# Patient Record
Sex: Male | Born: 1965 | Race: Black or African American | Hispanic: No | Marital: Single | Smoking: Former smoker
Health system: Southern US, Community
[De-identification: ages and names within clinical notes are randomized; demographics above are authoritative.]

## PROBLEM LIST (undated history)

## (undated) DIAGNOSIS — G40309 Generalized idiopathic epilepsy and epileptic syndromes, not intractable, without status epilepticus: Secondary | ICD-10-CM

## (undated) DIAGNOSIS — G40409 Other generalized epilepsy and epileptic syndromes, not intractable, without status epilepticus: Secondary | ICD-10-CM

## (undated) DIAGNOSIS — G20 Parkinson's disease: Secondary | ICD-10-CM

## (undated) DIAGNOSIS — R569 Unspecified convulsions: Secondary | ICD-10-CM

## (undated) DIAGNOSIS — J449 Chronic obstructive pulmonary disease, unspecified: Secondary | ICD-10-CM

## (undated) DIAGNOSIS — E119 Type 2 diabetes mellitus without complications: Secondary | ICD-10-CM

## (undated) DIAGNOSIS — G40909 Epilepsy, unspecified, not intractable, without status epilepticus: Secondary | ICD-10-CM

## (undated) DIAGNOSIS — E785 Hyperlipidemia, unspecified: Secondary | ICD-10-CM

## (undated) DIAGNOSIS — F319 Bipolar disorder, unspecified: Secondary | ICD-10-CM

## (undated) DIAGNOSIS — F0390 Unspecified dementia without behavioral disturbance: Secondary | ICD-10-CM

## (undated) DIAGNOSIS — J302 Other seasonal allergic rhinitis: Secondary | ICD-10-CM

## (undated) DIAGNOSIS — G20A1 Parkinson's disease without dyskinesia, without mention of fluctuations: Secondary | ICD-10-CM

## (undated) DIAGNOSIS — N400 Enlarged prostate without lower urinary tract symptoms: Secondary | ICD-10-CM

## (undated) HISTORY — PX: BRAIN SURGERY: SHX531

## (undated) HISTORY — PX: VENTRICULOSTOMY: SUR1435

---
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
## 2012-09-30 ENCOUNTER — Inpatient Hospital Stay: Payer: Self-pay | Admitting: Psychiatry

## 2012-10-01 LAB — BEHAVIORAL MEDICINE 1 PANEL
Albumin: 3.5 g/dL (ref 3.4–5.0)
Calcium, Total: 9.6 mg/dL (ref 8.5–10.1)
Chloride: 108 mmol/L — ABNORMAL HIGH (ref 98–107)
Co2: 29 mmol/L (ref 21–32)
EGFR (Non-African Amer.): >60
Eosinophil #: 0.2 10*3/uL (ref 0.0–0.7)
Eosinophil %: 2.5 %
Glucose: 84 mg/dL (ref 65–99)
HGB: 12 g/dL — ABNORMAL LOW (ref 13.0–18.0)
Lymphocyte #: 1.7 10*3/uL (ref 1.0–3.6)
Lymphocyte %: 22.2 %
MCH: 27.7 pg (ref 26.0–34.0)
MCHC: 34 g/dL (ref 32.0–36.0)
MCV: 82 fL (ref 80–100)
Monocyte #: 0.4 x10 3/mm (ref 0.2–1.0)
Osmolality: 278 (ref 275–301)
Potassium: 4.6 mmol/L (ref 3.5–5.1)
RDW: 12.8 % (ref 11.5–14.5)
SGPT (ALT): 19 U/L (ref 12–78)
Sodium: 141 mmol/L (ref 136–145)
Thyroid Stimulating Horm: 0.012 u[IU]/mL — ABNORMAL LOW
Total Protein: 6.8 g/dL (ref 6.4–8.2)
WBC: 7.6 10*3/uL (ref 3.8–10.6)

## 2012-10-03 LAB — LITHIUM LEVEL: Lithium: 0.7 mmol/L

## 2012-10-04 LAB — LITHIUM LEVEL: Lithium: 0.76 mmol/L

## 2012-10-06 LAB — BASIC METABOLIC PANEL
Chloride: 106 mmol/L (ref 98–107)
Co2: 25 mmol/L (ref 21–32)
EGFR (African American): >60
Glucose: 77 mg/dL (ref 65–99)
Potassium: 4.2 mmol/L (ref 3.5–5.1)

## 2012-10-06 LAB — URINALYSIS, COMPLETE
Bacteria: NONE SEEN
Bilirubin,UR: NEGATIVE
Glucose,UR: NEGATIVE mg/dL (ref 0–75)
Ketone: NEGATIVE
Protein: NEGATIVE
RBC,UR: NONE SEEN /HPF (ref 0–5)
Specific Gravity: 1.01 (ref 1.003–1.030)
WBC UR: <1 /HPF (ref 0–5)

## 2012-10-08 LAB — LITHIUM LEVEL: Lithium: 0.85 mmol/L

## 2012-10-13 LAB — LIPID PANEL
Ldl Cholesterol, Calc: 37 mg/dL (ref 0–100)
Triglycerides: 56 mg/dL (ref 0–200)
VLDL Cholesterol, Calc: 11 mg/dL (ref 5–40)

## 2012-10-13 LAB — AMMONIA: Ammonia, Plasma: 38 mcmol/L — ABNORMAL HIGH (ref 11–32)

## 2012-10-13 LAB — FOLATE: Folic Acid: 13.5 ng/mL (ref 3.1–100.0)

## 2012-10-21 LAB — AMMONIA: Ammonia, Plasma: <25 mcmol/L (ref 11–32)

## 2012-10-27 LAB — BASIC METABOLIC PANEL
BUN: 13 mg/dL (ref 7–18)
Calcium, Total: 9.7 mg/dL (ref 8.5–10.1)
EGFR (African American): >60
EGFR (Non-African Amer.): >60
Glucose: 80 mg/dL (ref 65–99)
Osmolality: 271 (ref 275–301)
Potassium: 4.3 mmol/L (ref 3.5–5.1)
Sodium: 136 mmol/L (ref 136–145)

## 2012-10-27 LAB — LITHIUM LEVEL: Lithium: 0.83 mmol/L

## 2012-11-27 ENCOUNTER — Emergency Department (HOSPITAL_COMMUNITY)
Admission: EM | Admit: 2012-11-27 | Discharge: 2012-11-27 | Disposition: A | Discharge status: Home / Self Care | Payer: Medicaid Other | Attending: Emergency Medicine | Admitting: Emergency Medicine

## 2012-11-27 ENCOUNTER — Encounter (HOSPITAL_COMMUNITY): Payer: Self-pay | Admitting: Emergency Medicine

## 2012-11-27 DIAGNOSIS — E119 Type 2 diabetes mellitus without complications: Secondary | ICD-10-CM | POA: Insufficient documentation

## 2012-11-27 DIAGNOSIS — Z8709 Personal history of other diseases of the respiratory system: Secondary | ICD-10-CM | POA: Insufficient documentation

## 2012-11-27 DIAGNOSIS — F0390 Unspecified dementia without behavioral disturbance: Secondary | ICD-10-CM | POA: Insufficient documentation

## 2012-11-27 DIAGNOSIS — Z87891 Personal history of nicotine dependence: Secondary | ICD-10-CM | POA: Insufficient documentation

## 2012-11-27 DIAGNOSIS — Z760 Encounter for issue of repeat prescription: Secondary | ICD-10-CM | POA: Insufficient documentation

## 2012-11-27 DIAGNOSIS — G40909 Epilepsy, unspecified, not intractable, without status epilepticus: Secondary | ICD-10-CM | POA: Insufficient documentation

## 2012-11-27 DIAGNOSIS — G20A1 Parkinson's disease without dyskinesia, without mention of fluctuations: Secondary | ICD-10-CM | POA: Insufficient documentation

## 2012-11-27 DIAGNOSIS — E785 Hyperlipidemia, unspecified: Secondary | ICD-10-CM | POA: Insufficient documentation

## 2012-11-27 DIAGNOSIS — Z87448 Personal history of other diseases of urinary system: Secondary | ICD-10-CM | POA: Insufficient documentation

## 2012-11-27 DIAGNOSIS — J449 Chronic obstructive pulmonary disease, unspecified: Secondary | ICD-10-CM | POA: Insufficient documentation

## 2012-11-27 DIAGNOSIS — F319 Bipolar disorder, unspecified: Secondary | ICD-10-CM | POA: Insufficient documentation

## 2012-11-27 DIAGNOSIS — J4489 Other specified chronic obstructive pulmonary disease: Secondary | ICD-10-CM | POA: Insufficient documentation

## 2012-11-27 DIAGNOSIS — G20 Parkinson's disease: Secondary | ICD-10-CM | POA: Insufficient documentation

## 2012-11-27 HISTORY — DX: Benign prostatic hyperplasia without lower urinary tract symptoms: N40.0

## 2012-11-27 HISTORY — DX: Parkinson's disease without dyskinesia, without mention of fluctuations: G20.A1

## 2012-11-27 HISTORY — DX: Unspecified convulsions: R56.9

## 2012-11-27 HISTORY — DX: Parkinson's disease: G20

## 2012-11-27 HISTORY — DX: Type 2 diabetes mellitus without complications: E11.9

## 2012-11-27 HISTORY — DX: Hyperlipidemia, unspecified: E78.5

## 2012-11-27 HISTORY — DX: Unspecified dementia, unspecified severity, without behavioral disturbance, psychotic disturbance, mood disturbance, and anxiety: F03.90

## 2012-11-27 HISTORY — DX: Other seasonal allergic rhinitis: J30.2

## 2012-11-27 HISTORY — DX: Bipolar disorder, unspecified: F31.9

## 2012-11-27 HISTORY — DX: Chronic obstructive pulmonary disease, unspecified: J44.9

## 2012-11-27 MED ORDER — ZOLPIDEM TARTRATE 10 MG PO TABS: 10.0000 mg | ORAL_TABLET | Freq: Every day | ORAL | Status: DC

## 2012-11-27 MED ORDER — LITHIUM CARBONATE 300 MG PO CAPS: 450.0000 mg | ORAL_CAPSULE | Freq: Two times a day (BID) | ORAL | Status: DC

## 2012-11-27 MED ORDER — TRIHEXYPHENIDYL HCL 5 MG PO TABS: 5.0000 mg | ORAL_TABLET | Freq: Two times a day (BID) | ORAL | Status: DC

## 2012-11-27 MED ORDER — QUETIAPINE FUMARATE 300 MG PO TABS: 600.0000 mg | ORAL_TABLET | Freq: Two times a day (BID) | ORAL | Status: DC

## 2012-11-27 MED ORDER — METFORMIN HCL 500 MG PO TABS: 500.0000 mg | ORAL_TABLET | Freq: Two times a day (BID) | ORAL | Status: DC

## 2012-11-27 MED ORDER — FLUTICASONE PROPIONATE 50 MCG/ACT NA SUSP: 2.0000 | Freq: Two times a day (BID) | NASAL | Status: DC

## 2012-11-27 MED ORDER — LEVETIRACETAM 500 MG PO TABS: 1000.0000 mg | ORAL_TABLET | Freq: Two times a day (BID) | ORAL | Status: DC

## 2012-11-27 MED ORDER — SIMVASTATIN 20 MG PO TABS: 20.0000 mg | ORAL_TABLET | Freq: Every day | ORAL | Status: DC

## 2012-11-27 MED ORDER — ALBUTEROL SULFATE HFA 108 (90 BASE) MCG/ACT IN AERS: 2.0000 | INHALATION_SPRAY | RESPIRATORY_TRACT | Status: DC | PRN

## 2012-11-27 MED ORDER — TAMSULOSIN HCL 0.4 MG PO CAPS: 0.4000 mg | ORAL_CAPSULE | Freq: Every day | ORAL | Status: AC

## 2012-11-27 NOTE — ED Provider Notes (Signed)
Medical screening examination/treatment/procedure(s) were performed by non-physician practitioner and as supervising physician I was immediately available for consultation/collaboration.   Shanna Cisco, MD 11/27/12 [phone]

## 2012-11-27 NOTE — ED Notes (Signed)
Pt  From home, was seen at his PCP on Thursday and was supposed to have rx's called in but they were never called to pharmacy. Pt needs meds until he can get with his PCP to re-call rx's. Pt has no c/o , just need refills. Pt in NAD. Pt A&O per his normal, pt has hx of dementia.

## 2012-11-27 NOTE — ED Provider Notes (Signed)
CSN: [id number]     Arrival date & time 11/27/12  1128 History   First MD Initiated Contact with Patient 11/27/12 1142     Chief Complaint  Patient presents with  . Medication Refill   (Consider location/radiation/quality/duration/timing/severity/associated sxs/prior Treatment) HPI Comments: Patient presents emergency department with chief complaint of medication refill. Patient states that he saw his primary care provider earlier this week, and that he was supposed to have his medications called in to pharmacy. Patient and his caregiver state that the medications never arrived. It is now Saturday, and they are unable to reach the primary care provider. They state that the patient has sufficient medications for today, but none for tomorrow. They will call the primary care provider on Monday to address the situation. He's not in any apparent distress. Denies any complaints at this time.  The history is provided by the patient. No language interpreter was used.    Past Medical History  Diagnosis Date  . Seasonal allergic rhinitis   . DM (diabetes mellitus)   . Hyperlipidemia   . COPD (chronic obstructive pulmonary disease)   . Bipolar 1 disorder   . Dementia   . Parkinson disease   . Seizures   . BPH (benign prostatic hyperplasia)    Past Surgical History  Procedure Laterality Date  . Brain surgery     No family history on file. History  Substance Use Topics  . Smoking status: Former Games developer  . Smokeless tobacco: Not on file  . Alcohol Use: No    Review of Systems  All other systems reviewed and are negative.    Allergies  Review of patient's allergies indicates no known allergies.  Home Medications   Current Outpatient Rx  Name  Route  Sig  Dispense  Refill  . albuterol (PROVENTIL HFA;VENTOLIN HFA) 108 (90 BASE) MCG/ACT inhaler   Inhalation   Inhale 2 puffs into the lungs every 4 (four) hours as needed for wheezing or shortness of breath.   1 Inhaler   0   .  fluticasone (FLONASE) 50 MCG/ACT nasal spray   Nasal   Place 2 sprays into the nose 2 (two) times daily.   16 g   0   . levETIRAcetam (KEPPRA) 500 MG tablet   Oral   Take 2 tablets (1,000 mg total) by mouth 2 (two) times daily.   60 tablet   0   . lithium carbonate 300 MG capsule   Oral   Take 2 capsules (600 mg total) by mouth 2 (two) times daily with a meal. Take 1.5 capsules BID   60 capsule   0   . metFORMIN (GLUCOPHAGE) 500 MG tablet   Oral   Take 1 tablet (500 mg total) by mouth 2 (two) times daily with a meal.   60 tablet   0   . QUEtiapine (SEROQUEL) 300 MG tablet   Oral   Take 2 tablets (600 mg total) by mouth 2 (two) times daily.   60 tablet   0   . simvastatin (ZOCOR) 20 MG tablet   Oral   Take 1 tablet (20 mg total) by mouth at bedtime.   30 tablet   0   . tamsulosin (FLOMAX) 0.4 MG CAPS capsule   Oral   Take 1 capsule (0.4 mg total) by mouth daily.   30 capsule   0   . trihexyphenidyl (ARTANE) 5 MG tablet   Oral   Take 1 tablet (5 mg total) by mouth 2 (two)  times daily with a meal.   60 tablet   0   . zolpidem (AMBIEN) 10 MG tablet   Oral   Take 1 tablet (10 mg total) by mouth at bedtime.   30 tablet   0    BP 134/82  Pulse 99  Temp(Src) 97.8 F (36.6 C) (Oral)  Resp 20  SpO2 100% Physical Exam  Nursing note and vitals reviewed. Constitutional: He is oriented to person, place, and time. He appears well-developed and well-nourished.  HENT:  Head: Normocephalic and atraumatic.  Eyes: Conjunctivae and EOM are normal. Right eye exhibits no discharge. Left eye exhibits no discharge. No scleral icterus.  Neck: Normal range of motion. Neck supple. No JVD present.  Cardiovascular: Normal rate, regular rhythm and normal heart sounds.  Exam reveals no gallop and no friction rub.   No murmur heard. Pulmonary/Chest: Effort normal and breath sounds normal. No respiratory distress. He has no wheezes. He has no rales. He exhibits no tenderness.   Abdominal: Soft. He exhibits no distension and no mass. There is no tenderness. There is no rebound and no guarding.  Musculoskeletal: Normal range of motion. He exhibits no edema and no tenderness.  Neurological: He is alert and oriented to person, place, and time.  Skin: Skin is warm and dry.  Psychiatric: He has a normal mood and affect. His behavior is normal. Judgment and thought content normal.    ED Course  Procedures (including critical care time) Labs Review Labs Reviewed - No data to display Imaging Review No results found.  MDM   1. Medication refill    Patient needing a medication refill. Will refill the patient's medications for one month. Recommend that he call his primary care provider on Monday to address the situation. Patient caregiver understand and agree with plan. The patient is medications are administered for the patient at his group home. Vital signs stable. Not in any apparent distress. Stable and ready for discharge.   Roxy Horseman, PA-C 11/27/12 1217

## 2012-12-06 ENCOUNTER — Emergency Department (HOSPITAL_COMMUNITY)
Admission: EM | Admit: 2012-12-06 | Discharge: 2012-12-06 | Disposition: A | Discharge status: Skilled Nursing Facility | Payer: Medicaid Other | Attending: Emergency Medicine | Admitting: Emergency Medicine

## 2012-12-06 DIAGNOSIS — J449 Chronic obstructive pulmonary disease, unspecified: Secondary | ICD-10-CM | POA: Insufficient documentation

## 2012-12-06 DIAGNOSIS — N400 Enlarged prostate without lower urinary tract symptoms: Secondary | ICD-10-CM | POA: Insufficient documentation

## 2012-12-06 DIAGNOSIS — F0390 Unspecified dementia without behavioral disturbance: Secondary | ICD-10-CM | POA: Insufficient documentation

## 2012-12-06 DIAGNOSIS — Z79899 Other long term (current) drug therapy: Secondary | ICD-10-CM | POA: Insufficient documentation

## 2012-12-06 DIAGNOSIS — R569 Unspecified convulsions: Secondary | ICD-10-CM

## 2012-12-06 DIAGNOSIS — E119 Type 2 diabetes mellitus without complications: Secondary | ICD-10-CM | POA: Insufficient documentation

## 2012-12-06 DIAGNOSIS — G20A1 Parkinson's disease without dyskinesia, without mention of fluctuations: Secondary | ICD-10-CM | POA: Insufficient documentation

## 2012-12-06 DIAGNOSIS — IMO0002 Reserved for concepts with insufficient information to code with codable children: Secondary | ICD-10-CM | POA: Insufficient documentation

## 2012-12-06 DIAGNOSIS — E785 Hyperlipidemia, unspecified: Secondary | ICD-10-CM | POA: Insufficient documentation

## 2012-12-06 DIAGNOSIS — F319 Bipolar disorder, unspecified: Secondary | ICD-10-CM | POA: Insufficient documentation

## 2012-12-06 DIAGNOSIS — G20 Parkinson's disease: Secondary | ICD-10-CM | POA: Insufficient documentation

## 2012-12-06 DIAGNOSIS — Z880 Allergy status to penicillin: Secondary | ICD-10-CM | POA: Insufficient documentation

## 2012-12-06 DIAGNOSIS — G40909 Epilepsy, unspecified, not intractable, without status epilepticus: Secondary | ICD-10-CM | POA: Insufficient documentation

## 2012-12-06 DIAGNOSIS — Z87891 Personal history of nicotine dependence: Secondary | ICD-10-CM | POA: Insufficient documentation

## 2012-12-06 DIAGNOSIS — J4489 Other specified chronic obstructive pulmonary disease: Secondary | ICD-10-CM | POA: Insufficient documentation

## 2012-12-06 LAB — CBC WITH DIFFERENTIAL/PLATELET
Eosinophils Relative: 3 % (ref 0–5)
HCT: 42 % (ref 39.0–52.0)
Hemoglobin: 14.2 g/dL (ref 13.0–17.0)
Lymphocytes Relative: 24 % (ref 12–46)
Lymphs Abs: 1.6 10*3/uL (ref 0.7–4.0)
MCH: 27.6 pg (ref 26.0–34.0)
MCV: 81.6 fL (ref 78.0–100.0)
Monocytes Absolute: 0.3 10*3/uL (ref 0.1–1.0)
Monocytes Relative: 4 % (ref 3–12)
RBC: 5.15 MIL/uL (ref 4.22–5.81)
RDW: 13.5 % (ref 11.5–15.5)
WBC: 6.7 10*3/uL (ref 4.0–10.5)

## 2012-12-06 LAB — BASIC METABOLIC PANEL
BUN: 13 mg/dL (ref 6–23)
CO2: 24 mEq/L (ref 19–32)
Chloride: 105 mEq/L (ref 96–112)
Creatinine, Ser: 0.9 mg/dL (ref 0.50–1.35)
GFR calc non Af Amer: >90 mL/min (ref >90)
Glucose, Bld: 73 mg/dL (ref 70–99)
Potassium: 4 mEq/L (ref 3.5–5.1)

## 2012-12-06 LAB — URINALYSIS, ROUTINE W REFLEX MICROSCOPIC
Glucose, UA: NEGATIVE mg/dL
Hgb urine dipstick: NEGATIVE
Ketones, ur: NEGATIVE mg/dL
Protein, ur: NEGATIVE mg/dL
pH: 7 (ref 5.0–8.0)

## 2012-12-06 MED ORDER — LORAZEPAM 2 MG/ML IJ SOLN
1.0000 mg | Freq: Once | INTRAMUSCULAR | Status: AC
  Administered 2012-12-06: 1 mg via INTRAVENOUS
  Filled 2012-12-06: qty 1

## 2012-12-06 MED ORDER — LEVETIRACETAM 500 MG PO TABS
500.0000 mg | ORAL_TABLET | Freq: Once | ORAL | Status: AC
  Administered 2012-12-06: 500 mg via ORAL
  Filled 2012-12-06: qty 1

## 2012-12-06 NOTE — ED Notes (Signed)
Per EMS patient from group home with Hx of asthma, bipolar and schizophrenia disorder had witnessed seizure, witness states seizure affected upper extremities and neck and that it lasted approximately five minutes. Per EMS, group home staff states patient had an additional witnessed seizure early this morning. Per EMS patient's mental baseline is disoriented, patient is currently oriented only to self.

## 2012-12-06 NOTE — ED Notes (Signed)
Bed: [facility] Expected date:  Expected time:  Means of arrival:  Comments: ems  

## 2012-12-06 NOTE — ED Provider Notes (Signed)
CSN: [id number]     Arrival date & time 12/06/12  1021 History   First MD Initiated Contact with Patient 12/06/12 1041     Chief Complaint  Patient presents with  . Seizures   (Consider location/radiation/quality/duration/timing/severity/associated sxs/prior Treatment) HPI Comments: Patient presents to the ER for evaluation of seizure. Patient reports that he has had a seizure disorder for 20 years. Patient had a generalized tonic-clonic seizure that lasted 5 minutes, witnessed by group home staff. Patient likely had an earlier seizure this morning as well. It is unclear how frequently he has seizures. Patient is postictal on arrival, but now is back to his normal baseline. He is awake, alert and oriented. Patient is without complaints. He reports that he has been taking all of his medications as prescribed.  Patient is a 47 y.o. male presenting with seizures.  Seizures   Past Medical History  Diagnosis Date  . Seasonal allergic rhinitis   . DM (diabetes mellitus)   . Hyperlipidemia   . COPD (chronic obstructive pulmonary disease)   . Bipolar 1 disorder   . Dementia   . Parkinson disease   . Seizures   . BPH (benign prostatic hyperplasia)    Past Surgical History  Procedure Laterality Date  . Brain surgery     No family history on file. History  Substance Use Topics  . Smoking status: Former Games developer  . Smokeless tobacco: Not on file  . Alcohol Use: No    Review of Systems  Neurological: Positive for seizures.  All other systems reviewed and are negative.    Allergies  Penicillins  Home Medications   Current Outpatient Rx  Name  Route  Sig  Dispense  Refill  . albuterol (PROVENTIL HFA;VENTOLIN HFA) 108 (90 BASE) MCG/ACT inhaler   Inhalation   Inhale 2 puffs into the lungs every 4 (four) hours as needed for wheezing or shortness of breath.   1 Inhaler   0   . fluticasone (FLONASE) 50 MCG/ACT nasal spray   Nasal   Place 2 sprays into the nose 2 (two) times  daily.   16 g   0   . levETIRAcetam (KEPPRA) 500 MG tablet   Oral   Take 2 tablets (1,000 mg total) by mouth 2 (two) times daily.   60 tablet   0   . lithium carbonate 300 MG capsule   Oral   Take 2 capsules (600 mg total) by mouth 2 (two) times daily with a meal. Take 1.5 capsules BID   60 capsule   0   . metFORMIN (GLUCOPHAGE) 500 MG tablet   Oral   Take 1 tablet (500 mg total) by mouth 2 (two) times daily with a meal.   60 tablet   0   . QUEtiapine (SEROQUEL) 300 MG tablet   Oral   Take 2 tablets (600 mg total) by mouth 2 (two) times daily.   60 tablet   0   . simvastatin (ZOCOR) 20 MG tablet   Oral   Take 1 tablet (20 mg total) by mouth at bedtime.   30 tablet   0   . tamsulosin (FLOMAX) 0.4 MG CAPS capsule   Oral   Take 1 capsule (0.4 mg total) by mouth daily.   30 capsule   0   . trihexyphenidyl (ARTANE) 5 MG tablet   Oral   Take 1 tablet (5 mg total) by mouth 2 (two) times daily with a meal.   60 tablet   0   .  zolpidem (AMBIEN) 10 MG tablet   Oral   Take 1 tablet (10 mg total) by mouth at bedtime.   30 tablet   0    BP 128/71  Pulse 102  Temp(Src) 97.5 F (36.4 C) (Oral)  Resp 16  SpO2 98% Physical Exam  Constitutional: He is oriented to person, place, and time. He appears well-developed and well-nourished. No distress.  HENT:  Head: Normocephalic and atraumatic.  Right Ear: Hearing normal.  Left Ear: Hearing normal.  Nose: Nose normal.  Mouth/Throat: Oropharynx is clear and moist and mucous membranes are normal.  Eyes: Conjunctivae and EOM are normal. Pupils are equal, round, and reactive to light.  Neck: Normal range of motion. Neck supple.  Cardiovascular: Regular rhythm, S1 normal and S2 normal.  Exam reveals no gallop and no friction rub.   No murmur heard. Pulmonary/Chest: Effort normal and breath sounds normal. No respiratory distress. He exhibits no tenderness.  Abdominal: Soft. Normal appearance and bowel sounds are normal.  There is no hepatosplenomegaly. There is no tenderness. There is no rebound, no guarding, no tenderness at McBurney's point and negative Murphy's sign. No hernia.  Musculoskeletal: Normal range of motion.  Neurological: He is alert and oriented to person, place, and time. He has normal strength. No cranial nerve deficit or sensory deficit. Coordination normal. GCS eye subscore is 4. GCS verbal subscore is 5. GCS motor subscore is 6.  Skin: Skin is warm, dry and intact. No rash noted. No cyanosis.  Psychiatric: He has a normal mood and affect. His speech is normal and behavior is normal. Thought content normal.    ED Course  Procedures (including critical care time)  EKG:  Date: 12/06/2012  Rate: 93  Rhythm: normal sinus rhythm  QRS Axis: normal  Intervals: normal  ST/T Wave abnormalities: normal  Conduction Disutrbances: none  Narrative Interpretation: unremarkable     Labs Review Labs Reviewed  URINALYSIS, ROUTINE W REFLEX MICROSCOPIC  CBC WITH DIFFERENTIAL  AMYLASE  BASIC METABOLIC PANEL  CBC WITH DIFFERENTIAL  PHENYTOIN LEVEL, TOTAL  PHENOBARBITAL LEVEL  VALPROIC ACID LEVEL   Imaging Review No results found.  MDM  Diagnosis: Seizure  Patient presents to the ER for evaluation after seizure. Patient was unsure what medication he actually takes for seizures. He had different times indicated phenobarbital Dilantin and Depakote. Our records indicate that he is on Keppra, however. Patient has not had any seizure activity here in the ER. He was given Ativan. Blood work was unremarkable. The patient will be returned to the group home, continue his current medications and followup with his neurologist.    Gilda Crease, MD 12/06/12 1525

## 2012-12-20 ENCOUNTER — Emergency Department (HOSPITAL_BASED_OUTPATIENT_CLINIC_OR_DEPARTMENT_OTHER)
Admission: EM | Admit: 2012-12-20 | Discharge: 2012-12-21 | Disposition: A | Discharge status: Other Acute Inpatient Hospital | Payer: Medicaid Other | Attending: Emergency Medicine | Admitting: Emergency Medicine

## 2012-12-20 ENCOUNTER — Encounter (HOSPITAL_BASED_OUTPATIENT_CLINIC_OR_DEPARTMENT_OTHER): Payer: Self-pay | Admitting: Emergency Medicine

## 2012-12-20 ENCOUNTER — Emergency Department (HOSPITAL_BASED_OUTPATIENT_CLINIC_OR_DEPARTMENT_OTHER): Payer: Medicaid Other

## 2012-12-20 DIAGNOSIS — S06379A Contusion, laceration, and hemorrhage of cerebellum with loss of consciousness of unspecified duration, initial encounter: Secondary | ICD-10-CM

## 2012-12-20 DIAGNOSIS — F0280 Dementia in other diseases classified elsewhere without behavioral disturbance: Secondary | ICD-10-CM | POA: Insufficient documentation

## 2012-12-20 DIAGNOSIS — E785 Hyperlipidemia, unspecified: Secondary | ICD-10-CM | POA: Insufficient documentation

## 2012-12-20 DIAGNOSIS — G40909 Epilepsy, unspecified, not intractable, without status epilepticus: Secondary | ICD-10-CM | POA: Insufficient documentation

## 2012-12-20 DIAGNOSIS — S060X9A Concussion with loss of consciousness of unspecified duration, initial encounter: Secondary | ICD-10-CM | POA: Insufficient documentation

## 2012-12-20 DIAGNOSIS — J4489 Other specified chronic obstructive pulmonary disease: Secondary | ICD-10-CM | POA: Insufficient documentation

## 2012-12-20 DIAGNOSIS — W1809XA Striking against other object with subsequent fall, initial encounter: Secondary | ICD-10-CM | POA: Insufficient documentation

## 2012-12-20 DIAGNOSIS — IMO0002 Reserved for concepts with insufficient information to code with codable children: Secondary | ICD-10-CM | POA: Insufficient documentation

## 2012-12-20 DIAGNOSIS — Z9889 Other specified postprocedural states: Secondary | ICD-10-CM | POA: Insufficient documentation

## 2012-12-20 DIAGNOSIS — F3110 Bipolar disorder, current episode manic without psychotic features, unspecified: Secondary | ICD-10-CM | POA: Insufficient documentation

## 2012-12-20 DIAGNOSIS — W010XXA Fall on same level from slipping, tripping and stumbling without subsequent striking against object, initial encounter: Secondary | ICD-10-CM | POA: Insufficient documentation

## 2012-12-20 DIAGNOSIS — N400 Enlarged prostate without lower urinary tract symptoms: Secondary | ICD-10-CM | POA: Insufficient documentation

## 2012-12-20 DIAGNOSIS — J449 Chronic obstructive pulmonary disease, unspecified: Secondary | ICD-10-CM | POA: Insufficient documentation

## 2012-12-20 DIAGNOSIS — Y9389 Activity, other specified: Secondary | ICD-10-CM | POA: Insufficient documentation

## 2012-12-20 DIAGNOSIS — E119 Type 2 diabetes mellitus without complications: Secondary | ICD-10-CM | POA: Insufficient documentation

## 2012-12-20 DIAGNOSIS — Z87891 Personal history of nicotine dependence: Secondary | ICD-10-CM | POA: Insufficient documentation

## 2012-12-20 DIAGNOSIS — Z79899 Other long term (current) drug therapy: Secondary | ICD-10-CM | POA: Insufficient documentation

## 2012-12-20 DIAGNOSIS — G20A1 Parkinson's disease without dyskinesia, without mention of fluctuations: Secondary | ICD-10-CM | POA: Insufficient documentation

## 2012-12-20 DIAGNOSIS — Y9210 Unspecified residential institution as the place of occurrence of the external cause: Secondary | ICD-10-CM | POA: Insufficient documentation

## 2012-12-20 DIAGNOSIS — W19XXXA Unspecified fall, initial encounter: Secondary | ICD-10-CM

## 2012-12-20 DIAGNOSIS — G20 Parkinson's disease: Secondary | ICD-10-CM | POA: Insufficient documentation

## 2012-12-20 LAB — BASIC METABOLIC PANEL
BUN: 16 mg/dL (ref 6–23)
Calcium: 10.5 mg/dL (ref 8.4–10.5)
Chloride: 105 mEq/L (ref 96–112)
Creatinine, Ser: 1 mg/dL (ref 0.50–1.35)
GFR calc Af Amer: >90 mL/min (ref >90)
Glucose, Bld: 104 mg/dL — ABNORMAL HIGH (ref 70–99)
Potassium: 3.5 mEq/L (ref 3.5–5.1)

## 2012-12-20 LAB — URINALYSIS, ROUTINE W REFLEX MICROSCOPIC
Bilirubin Urine: NEGATIVE
Glucose, UA: NEGATIVE mg/dL
Hgb urine dipstick: NEGATIVE
Ketones, ur: NEGATIVE mg/dL
Leukocytes, UA: NEGATIVE
Nitrite: NEGATIVE
Protein, ur: NEGATIVE mg/dL
pH: 6 (ref 5.0–8.0)

## 2012-12-20 LAB — CBC WITH DIFFERENTIAL/PLATELET
Basophils Absolute: 0 10*3/uL (ref 0.0–0.1)
Basophils Relative: 0 % (ref 0–1)
Eosinophils Relative: 2 % (ref 0–5)
HCT: 41.2 % (ref 39.0–52.0)
MCH: 28 pg (ref 26.0–34.0)
MCHC: 33.7 g/dL (ref 30.0–36.0)
MCV: 83.1 fL (ref 78.0–100.0)
Monocytes Absolute: 0.4 10*3/uL (ref 0.1–1.0)
Monocytes Relative: 4 % (ref 3–12)
Neutrophils Relative %: 66 % (ref 43–77)
RDW: 14.2 % (ref 11.5–15.5)

## 2012-12-20 NOTE — ED Provider Notes (Signed)
CSN: [id number]     Arrival date & time 12/20/12  1836 History  This chart was scribed for Coral Ceo, PA, working with Shanna Cisco, MD by Blanchard Kelch, ED Scribe. This patient was seen in room MH06/MH06 and the patient's care was started at 7:01 PM.    Chief Complaint  Patient presents with  . Fall    Patient is a 47 y.o. male presenting with fall. The history is provided by the patient. No language interpreter was used.  Fall Pertinent negatives include no chest pain, no abdominal pain, no headaches and no shortness of breath.   HPI Comments: Austin Carney is a 47 y.o. male with a PMH of Bipolar disorder, DM, dementia, parkinson disease, seizures, HLD, COPD, and BPH who presents to the Emergency Department complaining of a fall.  Patient is a poor historian.  Patient repeatedly asking the same questions.  Patient states that about 1.5 hours ago he was walking to his bed when he started shaking. He states the next thing he remembers was waking up on the floor.  He said he may have fallen backwards hitting his head on the wall.  He denies any headache, injuries, wounds, tongue trauma, or loss of bowel/bladder function.  Patient states that he had this about a year ago when he had a seizure.  He states he has not had a seizure in a year. He is on Keppra with no missed doses.  Patient lives at a group home and is not sure who found him.  He has no complaints currently.  No neck pain, chest pain, SOB, abdominal pain, nausea, emesis, extremity pain, dizziness, lightheadedness, or wounds.     Past Medical History  Diagnosis Date  . Seasonal allergic rhinitis   . DM (diabetes mellitus)   . Hyperlipidemia   . COPD (chronic obstructive pulmonary disease)   . Bipolar 1 disorder   . Dementia   . Parkinson disease   . Seizures   . BPH (benign prostatic hyperplasia)    Past Surgical History  Procedure Laterality Date  . Brain surgery     History reviewed. No pertinent family  history. History  Substance Use Topics  . Smoking status: Former Games developer  . Smokeless tobacco: Not on file  . Alcohol Use: No    Review of Systems  Constitutional: Negative for fever, chills, activity change, appetite change and fatigue.  HENT: Negative for dental problem, mouth sores and sore throat.   Eyes: Negative for photophobia and visual disturbance.  Respiratory: Negative for cough, shortness of breath and wheezing.   Cardiovascular: Negative for chest pain and leg swelling.  Gastrointestinal: Negative for nausea, vomiting and abdominal pain.  Genitourinary: Negative for dysuria and hematuria.  Musculoskeletal: Negative for arthralgias, back pain, gait problem, joint swelling, myalgias, neck pain and neck stiffness.  Skin: Negative for color change and wound.  Neurological: Positive for seizures. Negative for dizziness, weakness, light-headedness, numbness and headaches.  All other systems reviewed and are negative.    Allergies  Review of patient's allergies indicates no known allergies.  Home Medications   Current Outpatient Rx  Name  Route  Sig  Dispense  Refill  . albuterol (PROVENTIL HFA;VENTOLIN HFA) 108 (90 BASE) MCG/ACT inhaler   Inhalation   Inhale 2 puffs into the lungs every 4 (four) hours as needed for wheezing or shortness of breath.   1 Inhaler   0   . fluticasone (FLONASE) 50 MCG/ACT nasal spray   Nasal   Place  2 sprays into the nose 2 (two) times daily.   16 g   0   . levETIRAcetam (KEPPRA) 500 MG tablet   Oral   Take 2 tablets (1,000 mg total) by mouth 2 (two) times daily.   60 tablet   0   . lithium carbonate 300 MG capsule   Oral   Take 2 capsules (600 mg total) by mouth 2 (two) times daily with a meal. Take 1.5 capsules BID   60 capsule   0   . metFORMIN (GLUCOPHAGE) 500 MG tablet   Oral   Take 1 tablet (500 mg total) by mouth 2 (two) times daily with a meal.   60 tablet   0   . QUEtiapine (SEROQUEL) 300 MG tablet   Oral   Take  2 tablets (600 mg total) by mouth 2 (two) times daily.   60 tablet   0   . simvastatin (ZOCOR) 20 MG tablet   Oral   Take 1 tablet (20 mg total) by mouth at bedtime.   30 tablet   0   . tamsulosin (FLOMAX) 0.4 MG CAPS capsule   Oral   Take 1 capsule (0.4 mg total) by mouth daily.   30 capsule   0   . trihexyphenidyl (ARTANE) 5 MG tablet   Oral   Take 1 tablet (5 mg total) by mouth 2 (two) times daily with a meal.   60 tablet   0   . zolpidem (AMBIEN) 10 MG tablet   Oral   Take 1 tablet (10 mg total) by mouth at bedtime.   30 tablet   0    Triage Vitals: BP 141/91  Pulse 102  Temp(Src) 99 F (37.2 C) (Oral)  Resp 20  SpO2 100%  Filed Vitals:   12/20/12 2125 12/20/12 2231 12/21/12 0003 12/21/12 0948  BP: 136/84 142/89 142/89 125/77  Pulse: 85 97 88 77  Temp: 98.3 F (36.8 C)   97.8 F (36.6 C)  TempSrc: Oral   Oral  Resp: 20 20 18    SpO2: 100% 100% 100% 100%    Physical Exam  Nursing note and vitals reviewed. Constitutional: He is oriented to person, place, and time. He appears well-developed and well-nourished. No distress.  HENT:  Head: Normocephalic and atraumatic.  Right Ear: External ear normal.  Left Ear: External ear normal.  Nose: Nose normal.  Mouth/Throat: Oropharynx is clear and moist. No oropharyngeal exudate.  No tenderness to palpation to the scalp throughout. No palpable step-offs, lacerations, or hematoma.  Surgical scar present on the right scalp.  TM's gray and translucent bilaterally.   Eyes: Conjunctivae and EOM are normal. Pupils are equal, round, and reactive to light. Right eye exhibits no discharge. Left eye exhibits no discharge.  Neck: Normal range of motion. Neck supple. No tracheal deviation present.  No cervical spinal or paraspinal tenderness to palpation throughout.  No limitations with ROM.   Cardiovascular: Normal rate, regular rhythm, normal heart sounds and intact distal pulses.  Exam reveals no gallop and no friction rub.    No murmur heard. Radial and dorsalis pedis pulses present and equal bilaterally  Pulmonary/Chest: Effort normal and breath sounds normal. No respiratory distress. He has no wheezes. He has no rales. He exhibits no tenderness.  Abdominal: Soft. Bowel sounds are normal. He exhibits no distension and no mass. There is no tenderness. There is no rebound and no guarding.  Musculoskeletal: Normal range of motion. He exhibits no edema and no tenderness.  No tenderness to palpation to the UE or LE throughout. Strength 5/5 in the UE and LE.  Patient able to ambulate without difficulty or ataxia  Neurological: He is alert and oriented to person, place, and time.  GCS 15. No focal neurological deficits. Sensation intact in the UE and LE. CN 2-12 intact. Finger to nose intact.   Skin: Skin is warm and dry. He is not diaphoretic.  Psychiatric:  Patient asking the same questions shortly after they are answered.  He consistently states the same phrases intermittently throughout exam. (repeating the alphabet backwards and reciting scripture verses). Patient laughing throughout exam.     ED Course  Procedures (including critical care time)  DIAGNOSTIC STUDIES: Oxygen Saturation is 100% on room air, normal by my interpretation.    COORDINATION OF CARE: 7:01 PM - Patient verbalizes understanding and agrees with treatment plan.   Labs Review Labs Reviewed - No data to display Imaging Review No results found.  EKG Interpretation   None        Date: 12/21/2012  Rate: 93  Rhythm: normal sinus rhythm  QRS Axis: normal  Intervals: normal  ST/T Wave abnormalities: normal  Conduction Disutrbances:none  Narrative Interpretation:   Old EKG Reviewed: none available  Results for orders placed during the hospital encounter of 12/20/12  CBC WITH DIFFERENTIAL      Result Value Range   WBC 9.0  4.0 - 10.5 K/uL   RBC 4.96  4.22 - 5.81 MIL/uL   Hemoglobin 13.9  13.0 - 17.0 g/dL   HCT 16.1  09.6 - 04.5  %   MCV 83.1  78.0 - 100.0 fL   MCH 28.0  26.0 - 34.0 pg   MCHC 33.7  30.0 - 36.0 g/dL   RDW 40.9  81.1 - 91.4 %   Platelets 222  150 - 400 K/uL   Neutrophils Relative % 66  43 - 77 %   Neutro Abs 5.9  1.7 - 7.7 K/uL   Lymphocytes Relative 28  12 - 46 %   Lymphs Abs 2.6  0.7 - 4.0 K/uL   Monocytes Relative 4  3 - 12 %   Monocytes Absolute 0.4  0.1 - 1.0 K/uL   Eosinophils Relative 2  0 - 5 %   Eosinophils Absolute 0.1  0.0 - 0.7 K/uL   Basophils Relative 0  0 - 1 %   Basophils Absolute 0.0  0.0 - 0.1 K/uL  BASIC METABOLIC PANEL      Result Value Range   Sodium 142  135 - 145 mEq/L   Potassium 3.5  3.5 - 5.1 mEq/L   Chloride 105  96 - 112 mEq/L   CO2 28  19 - 32 mEq/L   Glucose, Bld 104 (*) 70 - 99 mg/dL   BUN 16  6 - 23 mg/dL   Creatinine, Ser 7.82  0.50 - 1.35 mg/dL   Calcium 95.6  8.4 - 21.3 mg/dL   GFR calc non Af Amer 87 (*) >90 mL/min   GFR calc Af Amer >90  >90 mL/min  URINALYSIS, ROUTINE W REFLEX MICROSCOPIC      Result Value Range   Color, Urine YELLOW  YELLOW   APPearance CLEAR  CLEAR   Specific Gravity, Urine 1.022  1.005 - 1.030   pH 6.0  5.0 - 8.0   Glucose, UA NEGATIVE  NEGATIVE mg/dL   Hgb urine dipstick NEGATIVE  NEGATIVE   Bilirubin Urine NEGATIVE  NEGATIVE   Ketones, ur NEGATIVE  NEGATIVE mg/dL   Protein, ur NEGATIVE  NEGATIVE mg/dL   Urobilinogen, UA 1.0  0.0 - 1.0 mg/dL   Nitrite NEGATIVE  NEGATIVE   Leukocytes, UA NEGATIVE  NEGATIVE  LITHIUM LEVEL      Result Value Range   Lithium Lvl 0.44 (*) 0.80 - 1.40 mEq/L     MDM   1. Fall, initial encounter   2. Head injury, closed, with LOC of unknown duration, initial encounter     Austin Carney is a 47 y.o. male with a PMH of Bipolar disorder, DM, dementia, parkinson disease, seizures, HLD, COPD, and BPH who presents to the Emergency Department complaining of a fall. Head CT ordered due to history of fall with LOC and poor history.  EKG, CBC, BMP, UA, and lithium level ordered.    Rechecks   12:15 AM = Patient ambulating around the room in no acute distress. Discussed results and plan.  Patient in agreement with going back to the group home.      2:03 AM = spoke with caregiver who is present in the emergency room. Caregiver states that he does not feel comfortable taking the patient home. Caregiver states that the patient has been "manic". He states that Austin Carney has been labile with episodes of extreme joy/happiness followed by episodes of severe depression. He states that the patient is taking his medications as prescribed but they are "not working." He cannot get an appointment with the patient's psychiatrist and is out of the patient's Keppra, Lithium, and Seroquel (last doses given today).  He contacted Dr. Dyke Brackett PCP however he would not refill his psych medications.  I told him that I will refill his Keppra however the patient needs to see a psychiatrist to get his psych medications refilled.  Caregiver states the meds aren't working and believes that they have something to do with his "fall" today.  Caregiver states they heard a loud bang this evening and found a hole in the wall in his bedroom.  They state that Korver likely fell and hit his head through the wall, which is why he was sent to the ED today.      Patient was evaluated in the ED for a fall.  It is unclear whether the patient had a seizure. Caregivers did not report a post-ictal state. There was no external signs of trauma on exam. His head CT was negative for an acute intracranial process. He remained in no acute distress throughout his ED visit and had no complaints.  His labs were unremarkable.  His EKG negative for acute ischemic changes. His lithium level was low in the ED. Discharge instructions for Lithium are conflicting with two different dose levels.  Patient was likely being under dosed.  Caregiver was contacted by the RN and stated that the behavior at this time was his baseline.  When the caregiver  arrived he retracted this statement due to the low lithium levels.  He states that he has been more manic than usual and he requires psychiatric evaluation.  He does not feel safe taking the patient home.  Will consult TTS and await their opinion.    2:30 AM = Signing out care to Dr. Read Drivers who will await recommendation from behavioral health.     Final impressions: 1. Fall  2. Head injury  3. Bipolar disorder  4. Dementia  5. Seizure disorder     Luiz Iron PA-C   This patient was discussed with Dr. Micheline Maze  Jillyn Ledger, PA-C 12/22/12 [id number]

## 2012-12-20 NOTE — ED Notes (Signed)
Pt from group home,  Stumbled and fell against wall,  No loc, no complaint from pt, staff wanted him checked out pt a&o, ambulatory

## 2012-12-20 NOTE — ED Notes (Signed)
Patient transported to CT 

## 2012-12-20 NOTE — ED Notes (Signed)
tct group home at [phone] also attempted to call [phone] mr. Benit, manager of group, return call received from dara white, telephone number obtained for mr. benit [phone]. Spoke with [name], who stated that they do administer medications to patient daily and that he has been taking his medications as prescribed by physician

## 2012-12-21 MED ORDER — TAMSULOSIN HCL 0.4 MG PO CAPS
0.4000 mg | ORAL_CAPSULE | Freq: Every day | ORAL | Status: DC
  Administered 2012-12-21: 0.4 mg via ORAL
  Filled 2012-12-21: qty 1

## 2012-12-21 MED ORDER — HALOPERIDOL 5 MG PO TABS
ORAL_TABLET | ORAL | Status: AC
  Filled 2012-12-21: qty 1

## 2012-12-21 MED ORDER — SIMVASTATIN 20 MG PO TABS
20.0000 mg | ORAL_TABLET | Freq: Every day | ORAL | Status: DC
  Filled 2012-12-21: qty 1

## 2012-12-21 MED ORDER — LEVETIRACETAM 500 MG PO TABS: 1000.0000 mg | ORAL_TABLET | Freq: Two times a day (BID) | ORAL | Status: DC

## 2012-12-21 MED ORDER — HALOPERIDOL 5 MG PO TABS
5.0000 mg | ORAL_TABLET | Freq: Once | ORAL | Status: AC
  Administered 2012-12-21: 5 mg via ORAL

## 2012-12-21 MED ORDER — ZIPRASIDONE HCL 20 MG PO CAPS
20.0000 mg | ORAL_CAPSULE | Freq: Once | ORAL | Status: DC
  Filled 2012-12-21: qty 1

## 2012-12-21 MED ORDER — ACETAMINOPHEN 325 MG PO TABS: 650.0000 mg | ORAL_TABLET | ORAL | Status: DC | PRN

## 2012-12-21 MED ORDER — QUETIAPINE FUMARATE 300 MG PO TABS: 600.0000 mg | ORAL_TABLET | Freq: Two times a day (BID) | ORAL | Status: DC

## 2012-12-21 MED ORDER — LITHIUM CARBONATE 150 MG PO CAPS: 450.0000 mg | ORAL_CAPSULE | Freq: Two times a day (BID) | ORAL | Status: DC

## 2012-12-21 MED ORDER — ALUM & MAG HYDROXIDE-SIMETH 200-200-20 MG/5ML PO SUSP: 30.0000 mL | ORAL | Status: DC | PRN

## 2012-12-21 MED ORDER — LEVETIRACETAM 500 MG PO TABS
1000.0000 mg | ORAL_TABLET | Freq: Two times a day (BID) | ORAL | Status: DC
  Administered 2012-12-21: 1000 mg via ORAL
  Filled 2012-12-21: qty 2

## 2012-12-21 MED ORDER — ALBUTEROL SULFATE HFA 108 (90 BASE) MCG/ACT IN AERS: 2.0000 | INHALATION_SPRAY | RESPIRATORY_TRACT | Status: DC | PRN

## 2012-12-21 MED ORDER — METFORMIN HCL 500 MG PO TABS
500.0000 mg | ORAL_TABLET | Freq: Two times a day (BID) | ORAL | Status: DC
  Administered 2012-12-21: 500 mg via ORAL
  Filled 2012-12-21: qty 1

## 2012-12-21 MED ORDER — ONDANSETRON HCL 8 MG PO TABS: 4.0000 mg | ORAL_TABLET | Freq: Three times a day (TID) | ORAL | Status: DC | PRN

## 2012-12-21 NOTE — ED Notes (Signed)
Pt's brother Ramon Dredge returned phone call to this nurse. Ramon Dredge reports that pt has been to multiple places to stay, including group homes, an assisted living facility and [facility]; reports that pt is bipolar and that he goes from extremely happy and laughing to angry and throwing objects, therefore was unable to stay at [facility] home d/t children living in home. Ramon Dredge also reports that pt is often manipulative and that "He' doesn't like anywhere he goes. He thinks he has multiple options for a place to live and he does not". Ramon Dredge reports that he speaks frequently with the staff at the group home he is in now and that the facility is very clean, well-taken-care-of facility. When asked about the group home not getting prescriptions filled for pt and keeping f/u appointments, he was not aware of this issue. Ramon Dredge requested I call Tresa Endo at group home and request further information, and to call Ramon Dredge back with updates.

## 2012-12-21 NOTE — ED Notes (Signed)
Long conversation with caretaker, Ivonne Andrew, over the phone. Caretaker states that patient has not had follow up since last er visit for seizure. Pt was given 15 day script for keppra and dilantin, apparently there was some confusion as of the dosage of the dilantin and patient has only been receiving 450mg  dilantin bid instead of 600mg  bid. Md aware that patient will need a script for keppra, dilantin and seroquel prior to d/c as he can not get in for follow up until the 27th

## 2012-12-21 NOTE — ED Notes (Signed)
Long discussion with caretaker, who now states that he does not feel safe taking the patient home because ER doctor refuses to give him prescription for Lithium. Caretaker claims that he has been unable to get him in to see psychiatrist and general practitioner would not write for psychotropic medications. PA involved in conversation and agreement made to have patient evaluated by tele psych

## 2012-12-21 NOTE — ED Notes (Signed)
Tele psych speaking with pt in room via computer.
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
## 2012-12-21 NOTE — ED Notes (Signed)
Spoke with Tresa Endo, supervisor of group home and he sts he is getting all of pt's meds filled from the General Practitioner, but that he was not willing to fill Keppra, Seroquel or Lithium and that their soonest appt was not until Oct 27th, which was not definitely scheduled at this point. Kelly requests that I call [facility] at [phone] to make a f/u appt for him.

## 2012-12-21 NOTE — ED Notes (Signed)
Per patient request, called Ramon Dredge, his brother at [phone]; no answer; left message to call this RN back with [facility] phone number.

## 2012-12-21 NOTE — ED Notes (Signed)
Spoke with Melissa R at [facility] who states the following needs to take place:  1- Walk-In to [facility] M-F from 8:30am- 3p, for initial assessment -----has to have the following: [employer], Guardian Present, Photo ID 2. AFTER the initial assessment, they will schedule an appointment to see the MD and get prescriptions filled at appoint with MD.  First appt as of now is Nov 3. Informed ER MD and MD has provided enough medications to last until this appt.   All of this information has been relayed to Joselyn Glassman, supervisor of Group Home.

## 2012-12-21 NOTE — ED Provider Notes (Signed)
11:04 AM Psych has cleared patient, feel he is ok to go back on his psych meds (not taking at the group home). The group home has been called and acknowledges that they will take him back as long as he is back on his psychiatric medicines. He does not have another psych visit until 20 days from now. We'll restart his Keppra, lithium, and Seroquel so that he has enough to make it to his appointment.  Audree Camel, MD 12/21/12 [phone]

## 2012-12-21 NOTE — ED Notes (Signed)
Long discussion again with caretaker regarding medication refills. Informed caretaker that patient will need to follow up with psychiatrist or PCP for refills of medication. Caretaker, not understanding why we can not write for his medications. Explained to Austin Carney the reasons why ER doc VS Primary doc would write meds and that he will need to be monitored closely, which has not been happening recently.

## 2012-12-21 NOTE — ED Notes (Signed)
tct to tele psych, requesting estimated time for assessment, per tele psych, there are three ahead of him to be assessed, currently looking at sometime in early am before assessment can occur

## 2012-12-21 NOTE — ED Provider Notes (Signed)
6:26 AM Patient has been stable overnight. He was given Haldol 5 mg for agitation. Hold and orders have been in her but his psychiatric medications (specifically lithium, Seroquel and Artane) have been held pending psychiatric consultation; he has not been compliant with these medications recently.  Hanley Seamen, MD 12/21/12 [phone]

## 2012-12-22 NOTE — ED Provider Notes (Signed)
Medical screening examination/treatment/procedure(s) were performed by non-physician practitioner and as supervising physician I was immediately available for consultation/collaboration.  Javarian Jakubiak E Atlas Kuc, MD 12/22/12 1235 

## 2013-03-12 ENCOUNTER — Emergency Department (HOSPITAL_COMMUNITY)
Admission: EM | Admit: 2013-03-12 | Discharge: 2013-03-12 | Disposition: A | Discharge status: Home / Self Care | Payer: Medicaid Other | Attending: Emergency Medicine | Admitting: Emergency Medicine

## 2013-03-12 ENCOUNTER — Encounter (HOSPITAL_COMMUNITY): Payer: Self-pay | Admitting: Emergency Medicine

## 2013-03-12 DIAGNOSIS — Z87891 Personal history of nicotine dependence: Secondary | ICD-10-CM | POA: Insufficient documentation

## 2013-03-12 DIAGNOSIS — Z79899 Other long term (current) drug therapy: Secondary | ICD-10-CM | POA: Insufficient documentation

## 2013-03-12 DIAGNOSIS — J449 Chronic obstructive pulmonary disease, unspecified: Secondary | ICD-10-CM | POA: Insufficient documentation

## 2013-03-12 DIAGNOSIS — G20A1 Parkinson's disease without dyskinesia, without mention of fluctuations: Secondary | ICD-10-CM | POA: Insufficient documentation

## 2013-03-12 DIAGNOSIS — J4489 Other specified chronic obstructive pulmonary disease: Secondary | ICD-10-CM | POA: Insufficient documentation

## 2013-03-12 DIAGNOSIS — E785 Hyperlipidemia, unspecified: Secondary | ICD-10-CM | POA: Insufficient documentation

## 2013-03-12 DIAGNOSIS — Z760 Encounter for issue of repeat prescription: Secondary | ICD-10-CM | POA: Insufficient documentation

## 2013-03-12 DIAGNOSIS — G40909 Epilepsy, unspecified, not intractable, without status epilepticus: Secondary | ICD-10-CM | POA: Insufficient documentation

## 2013-03-12 DIAGNOSIS — G20 Parkinson's disease: Secondary | ICD-10-CM | POA: Insufficient documentation

## 2013-03-12 DIAGNOSIS — Z9889 Other specified postprocedural states: Secondary | ICD-10-CM | POA: Insufficient documentation

## 2013-03-12 DIAGNOSIS — E119 Type 2 diabetes mellitus without complications: Secondary | ICD-10-CM | POA: Insufficient documentation

## 2013-03-12 DIAGNOSIS — Z9109 Other allergy status, other than to drugs and biological substances: Secondary | ICD-10-CM | POA: Insufficient documentation

## 2013-03-12 DIAGNOSIS — N400 Enlarged prostate without lower urinary tract symptoms: Secondary | ICD-10-CM | POA: Insufficient documentation

## 2013-03-12 DIAGNOSIS — F319 Bipolar disorder, unspecified: Secondary | ICD-10-CM | POA: Insufficient documentation

## 2013-03-12 DIAGNOSIS — IMO0002 Reserved for concepts with insufficient information to code with codable children: Secondary | ICD-10-CM | POA: Insufficient documentation

## 2013-03-12 DIAGNOSIS — F0280 Dementia in other diseases classified elsewhere without behavioral disturbance: Secondary | ICD-10-CM | POA: Insufficient documentation

## 2013-03-12 LAB — CBC WITH DIFFERENTIAL/PLATELET
BASOS PCT: 0 % (ref 0–1)
Basophils Absolute: 0 10*3/uL (ref 0.0–0.1)
Eosinophils Absolute: 0.1 10*3/uL (ref 0.0–0.7)
Eosinophils Relative: 2 % (ref 0–5)
HCT: 39.2 % (ref 39.0–52.0)
Hemoglobin: 13.2 g/dL (ref 13.0–17.0)
LYMPHS ABS: 1.6 10*3/uL (ref 0.7–4.0)
Lymphocytes Relative: 25 % (ref 12–46)
MCH: 28.5 pg (ref 26.0–34.0)
MCHC: 33.7 g/dL (ref 30.0–36.0)
MCV: 84.7 fL (ref 78.0–100.0)
MONOS PCT: 4 % (ref 3–12)
Monocytes Absolute: 0.2 10*3/uL (ref 0.1–1.0)
NEUTROS ABS: 4.2 10*3/uL (ref 1.7–7.7)
NEUTROS PCT: 69 % (ref 43–77)
PLATELETS: 202 10*3/uL (ref 150–400)
RBC: 4.63 MIL/uL (ref 4.22–5.81)
RDW: 12.8 % (ref 11.5–15.5)
WBC: 6.1 10*3/uL (ref 4.0–10.5)

## 2013-03-12 LAB — BASIC METABOLIC PANEL
BUN: 14 mg/dL (ref 6–23)
CO2: 25 mEq/L (ref 19–32)
Calcium: 9.9 mg/dL (ref 8.4–10.5)
Chloride: 104 mEq/L (ref 96–112)
Creatinine, Ser: 1.03 mg/dL (ref 0.50–1.35)
GFR calc non Af Amer: 84 mL/min — ABNORMAL LOW (ref >90)
Glucose, Bld: 80 mg/dL (ref 70–99)
POTASSIUM: 4.3 meq/L (ref 3.7–5.3)
Sodium: 140 mEq/L (ref 137–147)

## 2013-03-12 LAB — LITHIUM LEVEL: Lithium Lvl: 1.19 mEq/L (ref 0.80–1.40)

## 2013-03-12 MED ORDER — FLUTICASONE PROPIONATE HFA 110 MCG/ACT IN AERO: 1.0000 | INHALATION_SPRAY | Freq: Two times a day (BID) | RESPIRATORY_TRACT | Status: DC

## 2013-03-12 MED ORDER — ALBUTEROL SULFATE HFA 108 (90 BASE) MCG/ACT IN AERS: 2.0000 | INHALATION_SPRAY | RESPIRATORY_TRACT | Status: AC | PRN

## 2013-03-12 MED ORDER — LORAZEPAM 2 MG/ML IJ SOLN
1.0000 mg | Freq: Once | INTRAMUSCULAR | Status: AC
  Administered 2013-03-12: 1 mg via INTRAVENOUS
  Filled 2013-03-12: qty 1

## 2013-03-12 NOTE — Discharge Instructions (Signed)
Scheduled an appointment with a doctor as soon as possible  Take your medications as scheduled  Return to the ED if you have any confusion, weakness, seizure lasting more than 2-5 minutes, fever, stiff neck, slurred speech, or any other concerns (see below)   Seizure, Adult A seizure is abnormal electrical activity in the brain. Seizures can cause a change in attention or behavior (altered mental status). Seizures often involve uncontrollable shaking (convulsions). Seizures usually last from 30 seconds to 2 minutes. Epilepsy is a brain disorder in which a patient has repeated seizures over time. CAUSES  There are many different problems that can cause seizures. In some cases, no cause is found. Common causes of seizures include:  Head injuries.  Brain tumors.  Infections.  Imbalance of chemicals in the blood.  Kidney failure or liver failure.  Heart disease.  Drug abuse.  Stroke.  Withdrawal from certain drugs or alcohol.  Birth defects.  Malfunction of a neurosurgical device placed in the brain. SYMPTOMS  Symptoms vary depending on the part of the brain that is involved. Right before a seizure, you may have a warning (aura) that a seizure is about to occur. An aura may include the following symptoms:   Fear or anxiety.  Nausea.  Feeling like the room is spinning (vertigo).  Vision changes, such as seeing flashing lights or spots. Common symptoms during a seizure include:  Convulsions.  Drooling.  Rapid eye movements.  Grunting.  Loss of bladder and bowel control.  Bitter taste in the mouth. After a seizure, you may feel confused and sleepy. You may also have an injury resulting from convulsions during the seizure. DIAGNOSIS  Your caregiver will perform a physical exam and run some tests to determine the type and cause of your seizure. These tests may include:  Blood tests.  A lumbar puncture test. In this test, a small amount of fluid is removed from the  spine and examined.  Electrocardiography (ECG). This test records the electrical activity in your heart.  Imaging tests, such as computed tomography (CT) scans or magnetic resonance imaging (MRI).  Electroencephalography (EEG). This test records the electrical activity in your brain. TREATMENT  Seizures usually stop on their own. Treatment will depend on the cause of your seizure. In some cases, medicine may be given to prevent future seizures. HOME CARE INSTRUCTIONS   If you are given medicines, take them exactly as prescribed by your caregiver.  Keep all follow-up appointments as directed by your caregiver.  Do not swim or drive until your caregiver says it is okay.  Teach friends and family what to do if you have a seizure. They should:  Lay you on the ground to prevent a fall.  Put a cushion under your head.  Loosen any tight clothing around your neck.  Turn you on your side. If vomiting occurs, this helps keep your airway clear.  Stay with you until you recover. SEEK IMMEDIATE MEDICAL CARE IF:  The seizure lasts longer than 2 to 5 minutes.  The seizure is severe or the person does not wake up after the seizure.  The person has altered mental status. Drive the person to the emergency department or call your local emergency services (911 in [place] MAKE SURE YOU:  Understand these instructions.  Will watch your condition.  Will get help right away if you are not doing well or get worse. Document Released: 02/22/2000 Document Revised: 05/19/2011 Document Reviewed: 02/12/2011 [facility] Patient Information 2014 [place], [place].  Medication Refill,  Emergency Department We have refilled your medication today as a courtesy to you. It is best for your medical care, however, to take care of getting refills done through your primary caregiver's office. They have your records and can do a better job of follow-up than we can in the emergency department. On maintenance  medications, we often only prescribe enough medications to get you by until you are able to see your regular caregiver. This is a more expensive way to refill medications. In the future, please plan for refills so that you will not have to use the emergency department for this. Thank you for your help. Your help allows [place] to better take care of the daily emergencies that enter our department. Document Released: 06/13/2003 Document Revised: 05/19/2011 Document Reviewed: 02/24/2005 [facility] Patient Information 2014 [place], [place].

## 2013-03-12 NOTE — ED Provider Notes (Signed)
CSN: [id number]     Arrival date & time 03/12/13  1248 History   First MD Initiated Contact with Patient 03/12/13 1300     Chief Complaint  Patient presents with  . Dizziness   (Consider location/radiation/quality/duration/timing/severity/associated sxs/prior Treatment) HPI Comments: Patient is a 48 year old male with history of bipolar 1 disorder, schizophrenia, seizure disorder who presents today with chief complaint of seizure aura. 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 He has not had a seizure in 4 months. He reports that he was placed on phenobarbital and Dilantin which controlled his seizures. He was then switched to Depakote to control both his seizure disorder and bipolar 1 disorder.  He now is taking Keppra and Lithium. He is here today to get his albuterol and flovent refilled as well as his lithium level checked. He denies any other complaint.   The history is provided by the patient. No language interpreter was used.    Past Medical History  Diagnosis Date  . Seasonal allergic rhinitis   . DM (diabetes mellitus)   . Hyperlipidemia   . COPD (chronic obstructive pulmonary disease)   . Bipolar 1 disorder   . Dementia   . Parkinson disease   . Seizures   . BPH (benign prostatic hyperplasia)    Past Surgical History  Procedure Laterality Date  . Brain surgery     History reviewed. No pertinent family history. History  Substance Use Topics  . Smoking status: Former Games developer  . Smokeless tobacco: Not on file  . Alcohol Use: No    Review of Systems  Constitutional: Negative for fever and chills.  Respiratory: Negative for shortness of breath.   Cardiovascular: Negative for chest pain.  Gastrointestinal: Negative for nausea, vomiting and abdominal pain.  Neurological: Negative for seizures (aura only).  All other systems reviewed and are negative.    Allergies  Review of patient's allergies indicates no known allergies.  Home  Medications   Current Outpatient Rx  Name  Route  Sig  Dispense  Refill  . albuterol (PROVENTIL HFA;VENTOLIN HFA) 108 (90 BASE) MCG/ACT inhaler   Inhalation   Inhale 2 puffs into the lungs every 4 (four) hours as needed for wheezing or shortness of breath.   1 Inhaler   0   . fluticasone (FLONASE) 50 MCG/ACT nasal spray   Nasal   Place 2 sprays into the nose 2 (two) times daily.   16 g   0   . levETIRAcetam (KEPPRA) 500 MG tablet   Oral   Take 2 tablets (1,000 mg total) by mouth every 12 (twelve) hours.   6 tablet   0   . lithium carbonate 150 MG capsule   Oral   Take 3 capsules (450 mg total) by mouth 2 (two) times daily with a meal. Take 1.5 capsules BID   120 capsule   0   . metFORMIN (GLUCOPHAGE) 500 MG tablet   Oral   Take 1 tablet (500 mg total) by mouth 2 (two) times daily with a meal.   60 tablet   0   . QUEtiapine (SEROQUEL) 300 MG tablet   Oral   Take 2 tablets (600 mg total) by mouth 2 (two) times daily.   80 tablet   0   . simvastatin (ZOCOR) 20 MG tablet   Oral   Take 1 tablet (20 mg total) by mouth at bedtime.   30 tablet   0   . tamsulosin (FLOMAX) 0.4  MG CAPS capsule   Oral   Take 1 capsule (0.4 mg total) by mouth daily.   30 capsule   0   . trihexyphenidyl (ARTANE) 5 MG tablet   Oral   Take 1 tablet (5 mg total) by mouth 2 (two) times daily with a meal.   60 tablet   0   . zolpidem (AMBIEN) 10 MG tablet   Oral   Take 1 tablet (10 mg total) by mouth at bedtime.   30 tablet   0    BP 134/76  Pulse 97  Temp(Src) 97.6 F (36.4 C) (Oral)  Resp 20  SpO2 100% Physical Exam  Nursing note and vitals reviewed. Constitutional: He is oriented to person, place, and time. He appears well-developed and well-nourished. He does not have a sickly appearance. He does not appear ill. No distress.  HENT:  Head: Normocephalic and atraumatic.  Right Ear: External ear normal.  Left Ear: External ear normal.  Nose: Nose normal.  Eyes:  Conjunctivae and EOM are normal. Pupils are equal, round, and reactive to light. Right eye exhibits no nystagmus. Left eye exhibits no nystagmus.  Neck: Normal range of motion. No tracheal deviation present.  Cardiovascular: Normal rate, regular rhythm, normal heart sounds, intact distal pulses and normal pulses.   Pulmonary/Chest: Effort normal and breath sounds normal. No stridor.  Abdominal: Soft. He exhibits no distension. There is no tenderness.  Musculoskeletal: Normal range of motion.  Neurological: He is alert and oriented to person, place, and time. He has normal strength. Coordination and gait normal.  Strength 5/5 in all extremities  Skin: Skin is warm and dry. He is not diaphoretic.  Psychiatric: His behavior is normal. His speech is rapid and/or pressured. His speech is not slurred. Cognition and memory are impaired. He expresses no homicidal and no suicidal ideation.  Patient frequently asking the same question and telling same story. He frequently recites the alphabet backwards and begins reciting the new testament.     ED Course  Procedures (including critical care time) Labs Review Labs Reviewed  CBC WITH DIFFERENTIAL  BASIC METABOLIC PANEL  LITHIUM LEVEL   Imaging Review No results found.  EKG Interpretation   None       MDM  No diagnosis found.  Patient presents to ED requesting a Lithium level and medication refills. Patient appears to be at his baseline from prior notes. He appears well, smiling and laughing through the entire exam. He frequently recites the alphabet backwards and the new testament. He feels improved after ativan and is compliant with his Keppra. No new seizures today. Pt is signed out to [employer]Palmer, PA-C at change of shift to follow up with blood work. I discussed this case with Dr. Blinda LeatherwoodPollina who agrees with plan. Vital signs stable at this time.     Mora BellmanHannah S Ledon Weihe, PA-C 03/12/13 1541

## 2013-03-12 NOTE — ED Provider Notes (Signed)
Medical screening examination/treatment/procedure(s) were performed by non-physician practitioner and as supervising physician I was immediately available for consultation/collaboration.  EKG Interpretation   None         Lyna Laningham N Roza Creamer, DO 03/12/13 2353 

## 2013-03-12 NOTE — ED Notes (Signed)
[facility] reports there may be a delay in transport due to several calls prior to this pt.

## 2013-03-12 NOTE — ED Notes (Signed)
Bed: WA07 Expected date: 03/12/13 Expected time: 12:46 PM Means of arrival: Ambulance Comments: "Feels like he is going to have a seizure"

## 2013-03-12 NOTE — ED Provider Notes (Signed)
3:40 PM = Received sign-out from [facility] Merrill PA-C. Await results of lab.  Dispo home if normal.     Results for orders placed during the hospital encounter of 03/12/13  CBC WITH DIFFERENTIAL      Result Value Range   WBC 6.1  4.0 - 10.5 K/uL   RBC 4.63  4.22 - 5.81 MIL/uL   Hemoglobin 13.2  13.0 - 17.0 g/dL   HCT 16.139.2  09.639.0 - 04.552.0 %   MCV 84.7  78.0 - 100.0 fL   MCH 28.5  26.0 - 34.0 pg   MCHC 33.7  30.0 - 36.0 g/dL   RDW 40.912.8  81.111.5 - 91.415.5 %   Platelets 202  150 - 400 K/uL   Neutrophils Relative % 69  43 - 77 %   Neutro Abs 4.2  1.7 - 7.7 K/uL   Lymphocytes Relative 25  12 - 46 %   Lymphs Abs 1.6  0.7 - 4.0 K/uL   Monocytes Relative 4  3 - 12 %   Monocytes Absolute 0.2  0.1 - 1.0 K/uL   Eosinophils Relative 2  0 - 5 %   Eosinophils Absolute 0.1  0.0 - 0.7 K/uL   Basophils Relative 0  0 - 1 %   Basophils Absolute 0.0  0.0 - 0.1 K/uL  BASIC METABOLIC PANEL      Result Value Range   Sodium 140  137 - 147 mEq/L   Potassium 4.3  3.7 - 5.3 mEq/L   Chloride 104  96 - 112 mEq/L   CO2 25  19 - 32 mEq/L   Glucose, Bld 80  70 - 99 mg/dL   BUN 14  6 - 23 mg/dL   Creatinine, Ser 7.821.03  0.50 - 1.35 mg/dL   Calcium 9.9  8.4 - 95.610.5 mg/dL   GFR calc non Af Amer 84 (*) >90 mL/min   GFR calc Af Amer >90  >90 mL/min  LITHIUM LEVEL      Result Value Range   Lithium Lvl 1.19  0.80 - 1.40 mEq/L    Rechecks  4:45 PM = Patient states he feels "fine" and has no complaints.  Asking if he can go home.  Moving about the room trying to get dressed.     Patient evaluated in the ED for aura of seizure, however, did not have any obvious seizure activity throughout his ED visit.  Patient at baseline.  Labs WNL.  Lithium level therapeutic.  Medications refilled.  Will be transferred back to his facility.  Return precautions, discharge instructions, and follow-up was discussed with the patient before discharge.      New Prescriptions   ALBUTEROL (PROVENTIL HFA;VENTOLIN HFA) 108 (90 BASE) MCG/ACT  INHALER    Inhale 2 puffs into the lungs every 2 (two) hours as needed for wheezing or shortness of breath (cough).   FLUTICASONE (FLOVENT HFA) 110 MCG/ACT INHALER    Inhale 1 puff into the lungs 2 (two) times daily.     Final impressions: 1. Seizure disorder   2. Medication refill      Greer EeJessica Katlin Annalina Needles PA-C          Jillyn LedgerJessica K Beverlie Kurihara, PA-C 03/12/13 1646

## 2013-03-12 NOTE — ED Notes (Addendum)
Clydie BraunKaren, NT attempted blood draw and was unsuccessful. Rn called main lab. PA made aware.

## 2013-03-12 NOTE — ED Notes (Signed)
Attempted iv start x2 and was unsuccessful.  Patty, CN made aware and will attempt start.

## 2013-03-12 NOTE — ED Notes (Signed)
Austin FellerSusan Coe, Rn attempted IV start then IV blew.  Austin PikesSusan will attempt IV start with ultrasound.

## 2013-03-12 NOTE — ED Notes (Signed)
PA Bedside 

## 2013-03-12 NOTE — ED Notes (Signed)
PER EMS- pt picked up from home with c/o dizziness and "feeling like he's going to have a seizure" x1 hour.  Reports last seizure xseveral months ago.  Pt has hx of bipolar disorder and schizophrenia.  Pt alert and oriented per baseline.  Pt reports "this is how he feels before he has a seizure".  Denies having a seizure.

## 2013-03-13 NOTE — ED Provider Notes (Signed)
Medical screening examination/treatment/procedure(s) were performed by non-physician practitioner and as supervising physician I was immediately available for consultation/collaboration.  EKG Interpretation   None         Gilda Creasehristopher J. Shawnna Pancake, MD 03/13/13 [phone]

## 2013-05-31 ENCOUNTER — Emergency Department (HOSPITAL_COMMUNITY)
Admission: EM | Admit: 2013-05-31 | Discharge: 2013-05-31 | Disposition: A | Discharge status: Home / Self Care | Payer: Medicaid Other | Attending: Emergency Medicine | Admitting: Emergency Medicine

## 2013-05-31 ENCOUNTER — Encounter (HOSPITAL_COMMUNITY): Payer: Self-pay | Admitting: Emergency Medicine

## 2013-05-31 DIAGNOSIS — G20A1 Parkinson's disease without dyskinesia, without mention of fluctuations: Secondary | ICD-10-CM | POA: Insufficient documentation

## 2013-05-31 DIAGNOSIS — F0390 Unspecified dementia without behavioral disturbance: Secondary | ICD-10-CM | POA: Insufficient documentation

## 2013-05-31 DIAGNOSIS — Z5181 Encounter for therapeutic drug level monitoring: Secondary | ICD-10-CM

## 2013-05-31 DIAGNOSIS — IMO0002 Reserved for concepts with insufficient information to code with codable children: Secondary | ICD-10-CM | POA: Insufficient documentation

## 2013-05-31 DIAGNOSIS — E785 Hyperlipidemia, unspecified: Secondary | ICD-10-CM | POA: Insufficient documentation

## 2013-05-31 DIAGNOSIS — J449 Chronic obstructive pulmonary disease, unspecified: Secondary | ICD-10-CM | POA: Insufficient documentation

## 2013-05-31 DIAGNOSIS — E119 Type 2 diabetes mellitus without complications: Secondary | ICD-10-CM | POA: Insufficient documentation

## 2013-05-31 DIAGNOSIS — G40909 Epilepsy, unspecified, not intractable, without status epilepticus: Secondary | ICD-10-CM | POA: Insufficient documentation

## 2013-05-31 DIAGNOSIS — J4489 Other specified chronic obstructive pulmonary disease: Secondary | ICD-10-CM | POA: Insufficient documentation

## 2013-05-31 DIAGNOSIS — F319 Bipolar disorder, unspecified: Secondary | ICD-10-CM | POA: Insufficient documentation

## 2013-05-31 DIAGNOSIS — Z87891 Personal history of nicotine dependence: Secondary | ICD-10-CM | POA: Insufficient documentation

## 2013-05-31 DIAGNOSIS — Z79899 Other long term (current) drug therapy: Secondary | ICD-10-CM | POA: Insufficient documentation

## 2013-05-31 DIAGNOSIS — G20 Parkinson's disease: Secondary | ICD-10-CM | POA: Insufficient documentation

## 2013-05-31 DIAGNOSIS — N400 Enlarged prostate without lower urinary tract symptoms: Secondary | ICD-10-CM | POA: Insufficient documentation

## 2013-05-31 LAB — VALPROIC ACID LEVEL

## 2013-05-31 LAB — LITHIUM LEVEL: LITHIUM LVL: 0.89 meq/L (ref 0.80–1.40)
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
## 2013-05-31 NOTE — ED Notes (Signed)
Pt escorted to discharge window. Verbalized understanding discharge instructions. In no acute distress.  Pt waiting in lobby for ride.

## 2013-05-31 NOTE — Discharge Instructions (Signed)
Please read and follow all provided instructions.  Your diagnoses today include:  1. Medication monitoring encounter     Tests performed today include:  Depakote level: Low. If you are supposed to be on this medication, you will need to check with your doctor.   Lithium level: Normal.  Vital signs. See below for your results today.   Medications prescribed:   None  Take any prescribed medications only as directed.  Home care instructions:  Follow any educational materials contained in this packet.  BE VERY CAREFUL not to take multiple medicines containing Tylenol (also called acetaminophen). Doing so can lead to an overdose which can damage your liver and cause liver failure and possibly death.   Follow-up instructions: Please follow-up with your primary care provider in the next 3 days for further evaluation of your symptoms. If you do not have a primary care doctor -- see below for referral information.   Return instructions:   Please return to the Emergency Department if you experience worsening symptoms.   Please return if you have any other emergent concerns.  Additional Information:  Your vital signs today were: BP 128/88   Pulse 111   Temp(Src) 98.8 F (37.1 C) (Oral)   Resp 18   SpO2 98% If your blood pressure (BP) was elevated above 135/85 this visit, please have this repeated by your doctor within one month. --------------
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
## 2013-05-31 NOTE — ED Notes (Signed)
Pt's group home, [facility]: [place] [phone]                                                                               [place] [phone]

## 2013-06-02 NOTE — ED Provider Notes (Signed)
Medical screening examination/treatment/procedure(s) were performed by non-physician practitioner and as supervising physician I was immediately available for consultation/collaboration.   EKG Interpretation None       Juliet RudeNathan R. Rubin PayorPickering, MD 06/02/13 1251

## 2014-02-02 ENCOUNTER — Encounter (HOSPITAL_COMMUNITY): Payer: Self-pay | Admitting: Emergency Medicine

## 2014-02-02 ENCOUNTER — Emergency Department (HOSPITAL_COMMUNITY)
Admission: EM | Admit: 2014-02-02 | Discharge: 2014-02-02 | Disposition: A | Discharge status: Home / Self Care | Payer: Medicaid Other | Attending: Emergency Medicine | Admitting: Emergency Medicine

## 2014-02-02 DIAGNOSIS — G40909 Epilepsy, unspecified, not intractable, without status epilepticus: Secondary | ICD-10-CM | POA: Diagnosis not present

## 2014-02-02 DIAGNOSIS — Z87891 Personal history of nicotine dependence: Secondary | ICD-10-CM | POA: Insufficient documentation

## 2014-02-02 DIAGNOSIS — Z79899 Other long term (current) drug therapy: Secondary | ICD-10-CM | POA: Insufficient documentation

## 2014-02-02 DIAGNOSIS — Z87448 Personal history of other diseases of urinary system: Secondary | ICD-10-CM | POA: Insufficient documentation

## 2014-02-02 DIAGNOSIS — G20 Parkinson's disease: Secondary | ICD-10-CM | POA: Diagnosis not present

## 2014-02-02 DIAGNOSIS — Z048 Encounter for examination and observation for other specified reasons: Secondary | ICD-10-CM | POA: Diagnosis present

## 2014-02-02 DIAGNOSIS — E785 Hyperlipidemia, unspecified: Secondary | ICD-10-CM | POA: Diagnosis not present

## 2014-02-02 DIAGNOSIS — J449 Chronic obstructive pulmonary disease, unspecified: Secondary | ICD-10-CM | POA: Insufficient documentation

## 2014-02-02 DIAGNOSIS — F319 Bipolar disorder, unspecified: Secondary | ICD-10-CM | POA: Diagnosis not present

## 2014-02-02 DIAGNOSIS — E119 Type 2 diabetes mellitus without complications: Secondary | ICD-10-CM | POA: Insufficient documentation

## 2014-02-02 DIAGNOSIS — F0390 Unspecified dementia without behavioral disturbance: Secondary | ICD-10-CM | POA: Insufficient documentation

## 2014-02-02 DIAGNOSIS — Z7951 Long term (current) use of inhaled steroids: Secondary | ICD-10-CM | POA: Insufficient documentation
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
## 2014-02-02 NOTE — ED Notes (Signed)
Pt comes in by [employer] from group home to see someone for seizures bc pt states that he "can feel them coming on".  Pt states that he takes Depakote for seizures and bipolar. States that he just gets auras now.  Pt requesting blood levels checked.  Pt states that he had seizures from age 89 to [age] and "prayed them away".  Pt also requesting to [employer] that he not go back to the group home.

## 2014-06-30 NOTE — H&P (Signed)
PATIENT NAME:  Austin Carney, Austin Carney MR#:  [id number] DATE OF BIRTH:  10-26-1965  DATE OF ADMISSION:  09/30/2012  REFERRING PHYSICIAN: [facility] Emergency Room doctor.   ATTENDING PHYSICIAN: Kristine Linea, M.D.   IDENTIFYING DATA: Mr. Antenucci is a 49 year old male with history of bipolar disorder.   CHIEF COMPLAINT: The patient is unable to state.   HISTORY OF PRESENT ILLNESS: The patient is not able to provide much information. Most of his history is obtained from his chart. We are in the process of contacting his  family. Mr Banks is a resident of an assisted living facility. His provider at the facility decided to discontinue lithium. The reasons are unclear, but reportedly, his lithium level was too high. On our initial assessment, kidney function tests are within normal limits. The patient deteriorated rapidly, became psychotic and started verbally threatening other residents at the facility. He was suspicious that his belongings are stollen, and he was unruly. He was brought to the Emergency Room of [facility]. He was restarted on lithium there, and continued on Lamictal that was meant as a substitute for the lithium. The patient did not display any unwanted behaviors in [facility] Emergency Room and was transferred to our unit for further treatment and stabilization.  Upon admission, the patient loudly protested his admission, seemed confused, not at all certain why he came to the hospital. In the evening, he received multiple doses of medications. I evaluated him in the morning when he was slightly oversedated from medicines still, but pleasant, polite and cooperative. He is asking for a can of soda, best [facility]. He states that his mother buys him 3 cases of [facility], so he has his own supply all the time. He was surprised to learn that he was taken off the lithium and made the comment, "They shouldn't" have done that."  He took medications  from our nurses, both oral and injections. He seems to tolerate medications well so far. The patient, as above, is unable to contribute to his history.  PAST PSYCHIATRIC HISTORY:  Per chart, there were previous hospitalizations, but the patient appears to be stable while on lithium.   FAMILY PSYCHIATRIC HISTORY: Unknown.   PAST MEDICAL HISTORY: Seizures, COPD, hypertension, diabetes, dyslipidemia, benign prostatic hypertrophy.   ALLERGIES: PENICILLIN.   MEDICATIONS ON ADMISSION: Flomax 0.5 mg daily, Zocor 20 mg at bedtime, metformin 500 mg twice daily, lithium CR 450 mg twice daily, lisinopril 20 mg daily, Keppra 1000 mg twice daily, Flovent 2 puffs twice daily, Flonase daily.   SOCIAL HISTORY: We know very little, but the patient is a resident at the assisted living facility, and he is allowed to return to the facility following hospitalization. He has a brother, who is very involved in his treatment and did accompany the patient to the Emergency Room of [facility]. We hear that there is also a girlfriend.   REVIEW OF SYSTEMS: Difficult to obtain.  The patient denies any pain or discomfort.   PHYSICAL EXAMINATION: VITAL SIGNS: Blood pressure 117/66, pulse 103, respirations 20, temperature 97.8.  GENERAL: This is a well-developed, tall male, slightly oversedated, sitting on the edge of his bed.  HEAD, EYES, EARS, NOSE, THROAT: The pupils are equal, round and reactive to light.  NECK: Supple. No thyromegaly.  LUNGS: Clear to auscultation. No dullness to percussion.  HEART: Regular rhythm and rate. No murmurs, rubs or gallops.  ABDOMEN: Soft, nontender, nondistended. Positive bowel sounds.  MUSCULOSKELETAL: Normal muscle strength in all  extremities.  SKIN: No rashes or bruises.  LYMPHATIC: No cervical adenopathy.  NEUROLOGIC: Cranial nerves II through XII are intact.   LABORATORY DATA: Most were done at the [facility].  Chemistries are within normal limits. Blood glucose 84.  Blood alcohol level is 0. LFTs within normal limits. TSH 0.012. CBC: White blood count 7.6, hemoglobin 12, hematocrit 35.5, platelets 271. Urine tox screen at [facility] negative for substances.   MENTAL STATUS EXAMINATION ON ADMISSION: The patient is alert, although he appears sleepy. He is oriented to person. He knows he is in the hospital. He is unable to tell me why. He is pleasant and cooperative. He sits on the edge of the bed wearing hospital scrubs. He maintains some eye contact. His speech is slow, slightly slurred. Mood is okay with a labile affect. Thought processing is slow. He denies thoughts of hurting himself. He did threaten residents at the facility, and reportedly threatened nursing staff verbally last night. He seems slightly paranoid. The nurse reports that the patient appears to attend to internal stimuli. He is hyperreligious. His cognition is difficult to assess. His insight and judgment are poor.   SUICIDE RISK ASSESSMENT ON ADMISSION: This is a patient with a long history of bipolar illness, who came to the hospital floridly psychotic after his lithium was stopped.   DIAGNOSES: AXIS I: Bipolar affective disorder, mixed episode with psychosis.  AXIS II: Deferred.  AXIS III: Hypertension, dyslipidemia, enlarged prostate, COPD. AXIS IV: Mental illness, medication changes, primary support.  AXIS V: Global assessment of functioning on admission 35.   PLAN: The patient was admitted to [facility] Behavioral Medicine unit for safety, stabilization and medication management. He was initially placed on suicide precautions and was closely monitored for any unsafe behaviors. He underwent full psychiatric and risk assessment. He received pharmacotherapy, individual and group psychotherapy, substance abuse counseling and support from therapeutic milieu.  1.  Psychosis and mood:  We will continue lithium 300 mg 3 times daily and recheck lithium level, as well or kidney  function tests. Geodon was started for further mood stabilization, along with Ativan.  2.  Agitation: The patient received initially a Geodon injection. He also has a combination of Haldol, Ativan and Benadryl available if needed.  3.  Insomnia:  We will start temazepam for sleep.  4.  Medical: We will continue all medications as prescribed by his primary providers with blood glucose and blood pressure monitoring.   DISPOSITION:  He will likely return to his assisted living facility.     ____________________________ Ellin GoodieJolanta B. Janiya Millirons, MD jbp:dmm D: 10/01/2012 12:16:00 ET T: 10/01/2012 13:16:42 ET JOB#: [id number]  cc: Lyndia Bury B. Jennet MaduroPucilowska, MD, <Dictator> Shari ProwsJOLANTA B Analise Glotfelty MD ELECTRONICALLY SIGNED 10/04/2012 21:41

## 2016-04-16 ENCOUNTER — Other Ambulatory Visit: Payer: Self-pay

## 2016-04-16 ENCOUNTER — Emergency Department (HOSPITAL_COMMUNITY)
Admission: EM | Admit: 2016-04-16 | Discharge: 2016-04-16 | Disposition: A | Discharge status: Home / Self Care | Payer: Medicaid Other | Attending: Emergency Medicine | Admitting: Emergency Medicine

## 2016-04-16 ENCOUNTER — Encounter (HOSPITAL_COMMUNITY): Payer: Self-pay | Admitting: Emergency Medicine

## 2016-04-16 ENCOUNTER — Emergency Department (HOSPITAL_COMMUNITY): Payer: Medicaid Other

## 2016-04-16 DIAGNOSIS — Z79899 Other long term (current) drug therapy: Secondary | ICD-10-CM | POA: Diagnosis not present

## 2016-04-16 DIAGNOSIS — J449 Chronic obstructive pulmonary disease, unspecified: Secondary | ICD-10-CM | POA: Diagnosis not present

## 2016-04-16 DIAGNOSIS — G20 Parkinson's disease: Secondary | ICD-10-CM | POA: Diagnosis not present

## 2016-04-16 DIAGNOSIS — Z87891 Personal history of nicotine dependence: Secondary | ICD-10-CM | POA: Diagnosis not present

## 2016-04-16 DIAGNOSIS — E119 Type 2 diabetes mellitus without complications: Secondary | ICD-10-CM | POA: Insufficient documentation

## 2016-04-16 DIAGNOSIS — R4182 Altered mental status, unspecified: Secondary | ICD-10-CM | POA: Diagnosis present

## 2016-04-16 DIAGNOSIS — R410 Disorientation, unspecified: Secondary | ICD-10-CM | POA: Insufficient documentation

## 2016-04-16 LAB — CBC WITH DIFFERENTIAL/PLATELET
BASOS ABS: 0 10*3/uL (ref 0.0–0.1)
Basophils Relative: 0 %
EOS ABS: 0.2 10*3/uL (ref 0.0–0.7)
EOS PCT: 2 %
HCT: 39.4 % (ref 39.0–52.0)
Hemoglobin: 13 g/dL (ref 13.0–17.0)
LYMPHS ABS: 2.2 10*3/uL (ref 0.7–4.0)
LYMPHS PCT: 24 %
MCH: 27.5 pg (ref 26.0–34.0)
MCHC: 33 g/dL (ref 30.0–36.0)
MCV: 83.3 fL (ref 78.0–100.0)
Monocytes Absolute: 0.5 10*3/uL (ref 0.1–1.0)
Monocytes Relative: 6 %
Neutro Abs: 6.2 10*3/uL (ref 1.7–7.7)
Neutrophils Relative %: 68 %
PLATELETS: 185 10*3/uL (ref 150–400)
RBC: 4.73 MIL/uL (ref 4.22–5.81)
RDW: 13.1 % (ref 11.5–15.5)
WBC: 9.1 10*3/uL (ref 4.0–10.5)

## 2016-04-16 LAB — COMPREHENSIVE METABOLIC PANEL
ALT: 10 U/L — AB (ref 17–63)
AST: 14 U/L — AB (ref 15–41)
Albumin: 4.4 g/dL (ref 3.5–5.0)
Alkaline Phosphatase: 53 U/L (ref 38–126)
Anion gap: 3 — ABNORMAL LOW (ref 5–15)
BUN: 15 mg/dL (ref 6–20)
CALCIUM: 9.6 mg/dL (ref 8.9–10.3)
CHLORIDE: 108 mmol/L (ref 101–111)
CO2: 31 mmol/L (ref 22–32)
CREATININE: 1.36 mg/dL — AB (ref 0.61–1.24)
GFR calc Af Amer: >60 mL/min (ref >60)
GFR calc non Af Amer: 58 mL/min — ABNORMAL LOW (ref >60)
GLUCOSE: 79 mg/dL (ref 65–99)
Potassium: 4 mmol/L (ref 3.5–5.1)
SODIUM: 142 mmol/L (ref 135–145)
Total Bilirubin: 0.7 mg/dL (ref 0.3–1.2)
Total Protein: 7.8 g/dL (ref 6.5–8.1)

## 2016-04-16 LAB — URINALYSIS, ROUTINE W REFLEX MICROSCOPIC
Bilirubin Urine: NEGATIVE
Glucose, UA: NEGATIVE mg/dL
HGB URINE DIPSTICK: NEGATIVE
Ketones, ur: NEGATIVE mg/dL
Leukocytes, UA: NEGATIVE
Nitrite: NEGATIVE
PROTEIN: NEGATIVE mg/dL
Specific Gravity, Urine: 1.009 (ref 1.005–1.030)
pH: 7 (ref 5.0–8.0)

## 2016-04-16 LAB — TSH: TSH: 5.28 u[IU]/mL — ABNORMAL HIGH (ref 0.350–4.500)

## 2016-04-16 MED ORDER — DIVALPROEX SODIUM 125 MG PO CSDR
125.0000 mg | DELAYED_RELEASE_CAPSULE | Freq: Three times a day (TID) | ORAL | Status: DC
  Administered 2016-04-16: 125 mg via ORAL
  Filled 2016-04-16 (×2): qty 1

## 2016-04-16 MED ORDER — BUSPIRONE HCL 10 MG PO TABS
15.0000 mg | ORAL_TABLET | Freq: Four times a day (QID) | ORAL | Status: DC
  Administered 2016-04-16: 15 mg via ORAL
  Filled 2016-04-16: qty 2

## 2016-04-16 MED ORDER — BENZTROPINE MESYLATE 1 MG PO TABS
1.0000 mg | ORAL_TABLET | Freq: Every day | ORAL | Status: DC
  Administered 2016-04-16: 1 mg via ORAL
  Filled 2016-04-16: qty 1

## 2016-04-16 MED ORDER — LEVETIRACETAM 250 MG PO TABS
250.0000 mg | ORAL_TABLET | Freq: Two times a day (BID) | ORAL | Status: DC
  Filled 2016-04-16: qty 1

## 2016-04-16 NOTE — ED Notes (Signed)
PT DISCHARGED. INSTRUCTIONS GIVEN TO PTAR STAFF AAOX3. PT IN NO APPARENT DISTRESS OR PAIN. THE OPPORTUNITY TO ASK QUESTIONS WAS PROVIDED. 

## 2016-04-16 NOTE — ED Provider Notes (Signed)
[facility]-EMERGENCY DEPT Provider Note   CSN: [id number] Arrival date & time: 04/16/16  1033     History   Chief Complaint Chief Complaint  Patient presents with  . Altered Mental Status    HPI Austin Carney is a 50 y.o. male.   Altered Mental Status     Remainder of history, ROS, and physical exam limited due to patient's condition (AMS). Additional information was obtained from EMS and sNF.   Level V Caveat.  RN note.  Per EMS, staff states patient has had increased confusion since Saturday. Staff states patient has been refusing his medications since Saturday as well.  Hx of dementia and Parkinsons. Denies falls, injury. Patient denies any complaint.  Past Medical History:  Diagnosis Date  . Bipolar 1 disorder (HCC)   . BPH (benign prostatic hyperplasia)   . COPD (chronic obstructive pulmonary disease) (HCC)   . Dementia   . DM (diabetes mellitus) (HCC)   . Hyperlipidemia   . Parkinson disease (HCC)   . Seasonal allergic rhinitis   . Seizures (HCC)     There are no active problems to display for this patient.   Past Surgical History:  Procedure Laterality Date  . BRAIN SURGERY         Home Medications    Prior to Admission medications   Medication Sig Start Date End Date Taking? Authorizing Provider  benztropine (COGENTIN) 0.5 MG tablet Take 0.5 mg by mouth at bedtime.   Yes Historical Provider, MD  benztropine (COGENTIN) 1 MG tablet Take 1 mg by mouth daily.   Yes Historical Provider, MD  busPIRone (BUSPAR) 15 MG tablet Take 15 mg by mouth 4 (four) times daily.   Yes Historical Provider, MD  divalproex (DEPAKOTE SPRINKLE) 125 MG capsule Take 125 mg by mouth 3 (three) times daily.   Yes Historical Provider, MD  haloperidol (HALDOL) 5 MG tablet Take 15 mg by mouth 2 (two) times daily.   Yes Historical Provider, MD  levETIRAcetam (KEPPRA) 250 MG tablet Take 250 mg by mouth 2 (two) times daily.   Yes Historical Provider, MD  lithium carbonate 300 MG  capsule Take 600 mg by mouth 2 (two) times daily with a meal.    Yes Historical Provider, MD  simvastatin (ZOCOR) 10 MG tablet Take 10 mg by mouth at bedtime.   Yes Historical Provider, MD  Skin Protectants, Misc. (EUCERIN) cream Apply 1 application topically daily.   Yes Historical Provider, MD  tamsulosin (FLOMAX) 0.4 MG CAPS capsule Take 1 capsule (0.4 mg total) by mouth daily. 11/27/12  Yes Roxy Horseman, PA-C  albuterol (PROVENTIL HFA;VENTOLIN HFA) 108 (90 BASE) MCG/ACT inhaler Inhale 2 puffs into the lungs every 2 (two) hours as needed for wheezing or shortness of breath (cough). Patient not taking: Reported on 04/16/2016 03/12/13   Junious Silk, PA-C    Family History No family history on file.  Social History Social History  Substance Use Topics  . Smoking status: Former Games developer  . Smokeless tobacco: Never Used  . Alcohol use No     Allergies   Penicillins   Review of Systems Review of Systems  Unable to perform ROS: Dementia     Physical Exam Updated Vital Signs BP 109/56 (BP Location: Left Arm)   Pulse 67   Temp 98.4 F (36.9 C) (Oral)   Resp 18   Ht 5\' 11"  (1.803 m)   Wt 180 lb (81.6 kg)   SpO2 100%   BMI 25.10 kg/m   Physical  Exam  Constitutional: He appears well-developed and well-nourished. No distress.  HENT:  Head: Normocephalic and atraumatic.  Right Ear: External ear normal.  Left Ear: External ear normal.  Nose: Nose normal.  Mouth/Throat: Mucous membranes are normal. No trismus in the jaw.  Eyes: Conjunctivae and EOM are normal. No scleral icterus.  Neck: Normal range of motion and phonation normal.  Cardiovascular: Normal rate and regular rhythm.   Pulmonary/Chest: Effort normal. No stridor. No respiratory distress.  Abdominal: He exhibits no distension.  Musculoskeletal: Normal range of motion. He exhibits no edema.  Neurological: He is alert. He is disoriented.  Skin: He is not diaphoretic.  Psychiatric: He has a normal mood and affect.  His behavior is normal.  Vitals reviewed.    ED Treatments / Results  Labs (all labs ordered are listed, but only abnormal results are displayed) Labs Reviewed  COMPREHENSIVE METABOLIC PANEL - Abnormal; Notable for the following:       Result Value   Creatinine, Ser 1.36 (*)    AST 14 (*)    ALT 10 (*)    GFR calc non Af Amer 58 (*)    Anion gap 3 (*)    All other components within normal limits  TSH - Abnormal; Notable for the following:    TSH 5.280 (*)    All other components within normal limits  CBC WITH DIFFERENTIAL/PLATELET  URINALYSIS, ROUTINE W REFLEX MICROSCOPIC  T4    EKG  EKG Interpretation  Date/Time:  Wednesday April 16 2016 11:37:30 EST Ventricular Rate:  68 PR Interval:    QRS Duration: 91 QT Interval:  410 QTC Calculation: 436 R Axis:   17 Text Interpretation:  Sinus rhythm Baseline wander in lead(s) V1 No significant change since last tracing Confirmed by [facility] MD, Graeson Nouri ([id number]) on 04/16/2016 4:59:35 PM       Radiology Ct Head Wo Contrast  Result Date: 04/16/2016 CLINICAL DATA:  Confusion. History of Parkinson's disease and dementia EXAM: CT HEAD WITHOUT CONTRAST TECHNIQUE: Contiguous axial images were obtained from the base of the skull through the vertex without intravenous contrast. COMPARISON:  December 20, 2012 FINDINGS: Brain: Moderate generalized ventricular prominence is stable. Sulci appear normal. There is no intracranial mass, hemorrhage, extra-axial fluid collection, or midline shift. Gray-white compartments appear normal. No acute infarct evident. Vascular: No hyperdense vessel. There is slight calcification in each carotid siphon region. Skull: Evidence of previous burr hole placement in the right frontal region, stable. Bony calvarium otherwise appears intact and unchanged. Sinuses/Orbits: Visualized paranasal sinuses are clear. Orbits appear symmetric bilaterally. Other: Mastoid air cells are clear. 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 No sulcal prominence. Evidence of previous burr hole placement in the right frontal region, unchanged. Currently no drainage device seen. No intracranial mass, hemorrhage, extra-axial fluid collection. Gray-white compartments appear unremarkable and stable. Electronically Signed   By: Bretta Bang III M.D.   On: 04/16/2016 12:03    Procedures Procedures (including critical care time)  Medications Ordered in ED Medications  benztropine (COGENTIN) tablet 1 mg (1 mg Oral Given 04/16/16 1734)  busPIRone (BUSPAR) tablet 15 mg (15 mg Oral Given 04/16/16 1734)  divalproex (DEPAKOTE SPRINKLE) capsule 125 mg (125 mg Oral Given 04/16/16 1735)  levETIRAcetam (KEPPRA) tablet 250 mg (not administered)     Initial Impression / Assessment and Plan / ED Course  I have reviewed the triage vital signs and the nursing notes.  Pertinent labs & imaging results that  were available during my care of the patient were reviewed by me and considered in my medical decision making (see chart for details).     No evidence of infectious process. No electrolyte derangements from lithium use. CT head negative. Patient provided with home medication. Safe for discharge with strict return precautions.     Final Clinical Impressions(s) / ED Diagnoses   Final diagnoses:  Confusion   Disposition: Discharge  Condition: Good  I have discussed the results, Dx and Tx plan with the patient who expressed understanding and agree(s) with the plan. Discharge instructions discussed at great length. The patient was given strict return precautions who verbalized understanding of the instructions. No further questions at time of discharge.    New Prescriptions   No medications on file    Follow Up: primary care provider   For close follow up to assess for      Nira ConnPedro Eduardo Jasleen Riepe, MD 04/16/16 [phone]

## 2016-04-16 NOTE — ED Notes (Signed)
Nurse is going to start an IV and collect labs 

## 2016-04-16 NOTE — ED Triage Notes (Signed)
Per EMS, staff states patient has had increased confusion since Saturday. Staff states patient has been refusing his medications since Saturday as well.  Hx of dementia and Parkinsons. Denies falls, injury. Patient denies any complaint. Patient is from [facility]

## 2016-04-16 NOTE — ED Notes (Signed)
ED Provider at bedside. 

## 2016-04-16 NOTE — ED Notes (Signed)
Bed: WA04 Expected date:  Expected time:  Means of arrival:  Comments: EMS- elderly, confusion

## 2016-04-16 NOTE — ED Notes (Signed)
Failed attmpt to collect labs 

## 2016-04-17 LAB — T4: T4 TOTAL: 6.2 ug/dL (ref 4.5–12.0)

## 2016-04-19 ENCOUNTER — Emergency Department (HOSPITAL_COMMUNITY): Payer: Medicaid Other

## 2016-04-19 ENCOUNTER — Inpatient Hospital Stay (HOSPITAL_COMMUNITY)
Admission: EM | Admit: 2016-04-19 | Discharge: 2016-04-22 | DRG: 637 | Disposition: A | Discharge status: Home / Self Care | Payer: Medicaid Other | Attending: Family Medicine | Admitting: Family Medicine

## 2016-04-19 ENCOUNTER — Encounter (HOSPITAL_COMMUNITY): Payer: Self-pay

## 2016-04-19 DIAGNOSIS — R4182 Altered mental status, unspecified: Secondary | ICD-10-CM | POA: Diagnosis present

## 2016-04-19 DIAGNOSIS — E860 Dehydration: Secondary | ICD-10-CM | POA: Diagnosis present

## 2016-04-19 DIAGNOSIS — E785 Hyperlipidemia, unspecified: Secondary | ICD-10-CM | POA: Insufficient documentation

## 2016-04-19 DIAGNOSIS — G20A1 Parkinson's disease without dyskinesia, without mention of fluctuations: Secondary | ICD-10-CM | POA: Diagnosis present

## 2016-04-19 DIAGNOSIS — Z880 Allergy status to penicillin: Secondary | ICD-10-CM

## 2016-04-19 DIAGNOSIS — E118 Type 2 diabetes mellitus with unspecified complications: Secondary | ICD-10-CM

## 2016-04-19 DIAGNOSIS — E872 Acidosis, unspecified: Secondary | ICD-10-CM | POA: Insufficient documentation

## 2016-04-19 DIAGNOSIS — F0390 Unspecified dementia without behavioral disturbance: Secondary | ICD-10-CM | POA: Diagnosis present

## 2016-04-19 DIAGNOSIS — Z8669 Personal history of other diseases of the nervous system and sense organs: Secondary | ICD-10-CM

## 2016-04-19 DIAGNOSIS — J449 Chronic obstructive pulmonary disease, unspecified: Secondary | ICD-10-CM | POA: Diagnosis present

## 2016-04-19 DIAGNOSIS — F02818 Dementia in other diseases classified elsewhere, unspecified severity, with other behavioral disturbance: Secondary | ICD-10-CM | POA: Diagnosis present

## 2016-04-19 DIAGNOSIS — F319 Bipolar disorder, unspecified: Secondary | ICD-10-CM | POA: Diagnosis present

## 2016-04-19 DIAGNOSIS — Z79899 Other long term (current) drug therapy: Secondary | ICD-10-CM

## 2016-04-19 DIAGNOSIS — F0281 Dementia in other diseases classified elsewhere with behavioral disturbance: Secondary | ICD-10-CM | POA: Diagnosis present

## 2016-04-19 DIAGNOSIS — I10 Essential (primary) hypertension: Secondary | ICD-10-CM | POA: Diagnosis present

## 2016-04-19 DIAGNOSIS — G40909 Epilepsy, unspecified, not intractable, without status epilepticus: Secondary | ICD-10-CM | POA: Diagnosis not present

## 2016-04-19 DIAGNOSIS — G9340 Encephalopathy, unspecified: Secondary | ICD-10-CM | POA: Diagnosis present

## 2016-04-19 DIAGNOSIS — E11649 Type 2 diabetes mellitus with hypoglycemia without coma: Principal | ICD-10-CM | POA: Diagnosis present

## 2016-04-19 DIAGNOSIS — G20C Parkinsonism, unspecified: Secondary | ICD-10-CM | POA: Diagnosis present

## 2016-04-19 DIAGNOSIS — F0280 Dementia in other diseases classified elsewhere without behavioral disturbance: Secondary | ICD-10-CM | POA: Diagnosis present

## 2016-04-19 DIAGNOSIS — E162 Hypoglycemia, unspecified: Secondary | ICD-10-CM | POA: Diagnosis not present

## 2016-04-19 DIAGNOSIS — N251 Nephrogenic diabetes insipidus: Secondary | ICD-10-CM | POA: Diagnosis present

## 2016-04-19 DIAGNOSIS — T50905A Adverse effect of unspecified drugs, medicaments and biological substances, initial encounter: Secondary | ICD-10-CM | POA: Diagnosis present

## 2016-04-19 DIAGNOSIS — G20 Parkinson's disease: Secondary | ICD-10-CM | POA: Diagnosis not present

## 2016-04-19 DIAGNOSIS — Z87891 Personal history of nicotine dependence: Secondary | ICD-10-CM

## 2016-04-19 DIAGNOSIS — G40309 Generalized idiopathic epilepsy and epileptic syndromes, not intractable, without status epilepticus: Secondary | ICD-10-CM | POA: Diagnosis present

## 2016-04-19 DIAGNOSIS — D72829 Elevated white blood cell count, unspecified: Secondary | ICD-10-CM | POA: Diagnosis present

## 2016-04-19 DIAGNOSIS — E861 Hypovolemia: Secondary | ICD-10-CM | POA: Diagnosis present

## 2016-04-19 DIAGNOSIS — N400 Enlarged prostate without lower urinary tract symptoms: Secondary | ICD-10-CM | POA: Diagnosis present

## 2016-04-19 DIAGNOSIS — G40409 Other generalized epilepsy and epileptic syndromes, not intractable, without status epilepticus: Secondary | ICD-10-CM | POA: Diagnosis present

## 2016-04-19 DIAGNOSIS — N179 Acute kidney failure, unspecified: Secondary | ICD-10-CM | POA: Diagnosis present

## 2016-04-19 HISTORY — DX: Generalized idiopathic epilepsy and epileptic syndromes, not intractable, without status epilepticus: G40.309

## 2016-04-19 HISTORY — DX: Epilepsy, unspecified, not intractable, without status epilepticus: G40.909

## 2016-04-19 HISTORY — DX: Other generalized epilepsy and epileptic syndromes, not intractable, without status epilepticus: G40.409

## 2016-04-19 LAB — URINALYSIS, ROUTINE W REFLEX MICROSCOPIC
Bilirubin Urine: NEGATIVE
GLUCOSE, UA: 50 mg/dL — AB
Hgb urine dipstick: NEGATIVE
Ketones, ur: NEGATIVE mg/dL
LEUKOCYTES UA: NEGATIVE
Nitrite: NEGATIVE
PH: 6 (ref 5.0–8.0)
Protein, ur: NEGATIVE mg/dL
SPECIFIC GRAVITY, URINE: 1.008 (ref 1.005–1.030)

## 2016-04-19 LAB — CBC WITH DIFFERENTIAL/PLATELET
Basophils Absolute: 0 10*3/uL (ref 0.0–0.1)
Basophils Relative: 0 %
EOS ABS: 0.1 10*3/uL (ref 0.0–0.7)
EOS PCT: 1 %
HCT: 34.8 % — ABNORMAL LOW (ref 39.0–52.0)
Hemoglobin: 11.3 g/dL — ABNORMAL LOW (ref 13.0–17.0)
LYMPHS ABS: 1.6 10*3/uL (ref 0.7–4.0)
LYMPHS PCT: 13 %
MCH: 27.6 pg (ref 26.0–34.0)
MCHC: 32.5 g/dL (ref 30.0–36.0)
MCV: 85.1 fL (ref 78.0–100.0)
MONOS PCT: 4 %
Monocytes Absolute: 0.5 10*3/uL (ref 0.1–1.0)
Neutro Abs: 9.6 10*3/uL — ABNORMAL HIGH (ref 1.7–7.7)
Neutrophils Relative %: 82 %
PLATELETS: 167 10*3/uL (ref 150–400)
RBC: 4.09 MIL/uL — AB (ref 4.22–5.81)
RDW: 13.1 % (ref 11.5–15.5)
WBC: 11.7 10*3/uL — AB (ref 4.0–10.5)

## 2016-04-19 LAB — I-STAT CG4 LACTIC ACID, ED
LACTIC ACID, VENOUS: 1.03 mmol/L (ref 0.5–1.9)
Lactic Acid, Venous: 2.01 mmol/L (ref 0.5–1.9)

## 2016-04-19 LAB — COMPREHENSIVE METABOLIC PANEL
ALBUMIN: 3.3 g/dL — AB (ref 3.5–5.0)
ALT: 12 U/L — AB (ref 17–63)
AST: 22 U/L (ref 15–41)
Alkaline Phosphatase: 49 U/L (ref 38–126)
Anion gap: 8 (ref 5–15)
BUN: 21 mg/dL — AB (ref 6–20)
CHLORIDE: 111 mmol/L (ref 101–111)
CO2: 21 mmol/L — AB (ref 22–32)
CREATININE: 1.58 mg/dL — AB (ref 0.61–1.24)
Calcium: 9.1 mg/dL (ref 8.9–10.3)
GFR calc Af Amer: 56 mL/min — ABNORMAL LOW (ref >60)
GFR, EST NON AFRICAN AMERICAN: 49 mL/min — AB (ref >60)
GLUCOSE: 167 mg/dL — AB (ref 65–99)
POTASSIUM: 4 mmol/L (ref 3.5–5.1)
SODIUM: 140 mmol/L (ref 135–145)
Total Bilirubin: 0.4 mg/dL (ref 0.3–1.2)
Total Protein: 6 g/dL — ABNORMAL LOW (ref 6.5–8.1)

## 2016-04-19 LAB — T4, FREE: FREE T4: 0.9 ng/dL (ref 0.61–1.12)

## 2016-04-19 LAB — CBG MONITORING, ED
GLUCOSE-CAPILLARY: 122 mg/dL — AB (ref 65–99)
GLUCOSE-CAPILLARY: 58 mg/dL — AB (ref 65–99)
GLUCOSE-CAPILLARY: 105 mg/dL — AB (ref 65–99)
Glucose-Capillary: 77 mg/dL (ref 65–99)

## 2016-04-19 LAB — LITHIUM LEVEL: Lithium Lvl: 0.97 mmol/L (ref 0.60–1.20)

## 2016-04-19 LAB — VALPROIC ACID LEVEL: Valproic Acid Lvl: 57 ug/mL (ref 50.0–100.0)

## 2016-04-19 LAB — ETHANOL: Alcohol, Ethyl (B): <5 mg/dL (ref <5)

## 2016-04-19 LAB — TSH: TSH: 1.17 u[IU]/mL (ref 0.350–4.500)

## 2016-04-19 MED ORDER — THIAMINE HCL 100 MG/ML IJ SOLN
100.0000 mg | Freq: Every day | INTRAMUSCULAR | Status: DC
  Administered 2016-04-20 – 2016-04-21 (×2): 100 mg via INTRAVENOUS
  Filled 2016-04-19 (×2): qty 2

## 2016-04-19 MED ORDER — SODIUM CHLORIDE 0.9 % IV BOLUS (SEPSIS)
1000.0000 mL | Freq: Once | INTRAVENOUS | Status: AC
  Administered 2016-04-19: 1000 mL via INTRAVENOUS

## 2016-04-19 MED ORDER — LEVOFLOXACIN IN D5W 750 MG/150ML IV SOLN
750.0000 mg | INTRAVENOUS | Status: DC
  Administered 2016-04-19 – 2016-04-20 (×2): 750 mg via INTRAVENOUS
  Filled 2016-04-19 (×2): qty 150

## 2016-04-19 MED ORDER — DEXTROSE-NACL 5-0.9 % IV SOLN
INTRAVENOUS | Status: DC
  Administered 2016-04-19: 18:00:00 via INTRAVENOUS
  Administered 2016-04-19: 1 mL via INTRAVENOUS

## 2016-04-19 MED ORDER — DEXTROSE 50 % IV SOLN
1.0000 | Freq: Once | INTRAVENOUS | Status: AC
  Administered 2016-04-19: 50 mL via INTRAVENOUS
  Filled 2016-04-19: qty 50

## 2016-04-19 MED ORDER — VANCOMYCIN HCL 10 G IV SOLR
1250.0000 mg | Freq: Once | INTRAVENOUS | Status: AC
  Administered 2016-04-19: 1250 mg via INTRAVENOUS
  Filled 2016-04-19: qty 1250

## 2016-04-19 MED ORDER — ENOXAPARIN SODIUM 60 MG/0.6ML ~~LOC~~ SOLN
60.0000 mg | SUBCUTANEOUS | Status: DC
  Administered 2016-04-20 – 2016-04-22 (×3): 60 mg via SUBCUTANEOUS
  Filled 2016-04-19 (×4): qty 0.6

## 2016-04-19 MED ORDER — VANCOMYCIN HCL IN DEXTROSE 750-5 MG/150ML-% IV SOLN
750.0000 mg | Freq: Two times a day (BID) | INTRAVENOUS | Status: DC
  Administered 2016-04-20 (×2): 750 mg via INTRAVENOUS
  Filled 2016-04-19 (×3): qty 150

## 2016-04-19 NOTE — ED Notes (Signed)
Attempted to call report

## 2016-04-19 NOTE — ED Notes (Signed)
Patient transported to CT 

## 2016-04-19 NOTE — ED Notes (Signed)
Pt given 2 cups of orange juice with 1 pack of sugar in it. Mental status unchanged.

## 2016-04-19 NOTE — ED Triage Notes (Signed)
[facility] Pt fell in hallway, did not hit head, no loc, no obvious injuries. Pt just refuses to follow commands. Pt fully awake. Occasionally pt is speaking with NH staff and EMS. CBG 58 with nursing staff, given oral glucose 25g and pt took with no problem. CBG 120 with EMS. BP 100/60, No neuro deficits, HR 86 NSR, spo2 100%.

## 2016-04-19 NOTE — ED Notes (Signed)
Pt removed both IVs. Bleeding controlled. IV access regained by this RN. Pt's mental status unchanged. Pt still following all commands and will answer only few questions correctly. Pt cleaned up and new gown in place.

## 2016-04-19 NOTE — ED Notes (Signed)
Pt given sprite per RN. 

## 2016-04-19 NOTE — ED Notes (Signed)
Pt followed all commands with this Rn and Dr Clarene DukeLittle, pt keeps stating "I have to find my family" over and over.

## 2016-04-19 NOTE — ED Notes (Signed)
CBG 58, pt given orange juice after passing swallow screen

## 2016-04-19 NOTE — Progress Notes (Signed)
Pharmacy Antibiotic Note  Austin Carney is a 51 y.o. male admitted on 04/19/2016 with sepsis.  Pharmacy has been consulted for levaquin and vancomycin dosing.  Patient was loaded with vancomycin 1250mg  IV once in the ED.  Plan: Vancomycin 750mg  IV every 12 hours.  Goal trough 15-20 mcg/mL.  Levaquin 750mg  IV q24h Monitor culture data, renal function and clinical course VT at SS prn  Height: 5\' 11"  (180.3 cm) Weight: 180 lb (81.6 kg) IBW/kg (Calculated) : 75.3  Temp (24hrs), Avg:97.7 F (36.5 C), Min:97.6 F (36.4 C), Max:97.8 F (36.6 C)   Recent Labs Lab 04/16/16 1308 04/19/16 1717 04/19/16 1726  WBC 9.1 11.7*  --   CREATININE 1.36* 1.58*  --   LATICACIDVEN  --   --  2.01*    Estimated Creatinine Clearance: 58.2 mL/min (by C-G formula based on SCr of 1.58 mg/dL (H)).    Allergies  Allergen Reactions  . Penicillins Other (See Comments)    Unknown- on MAR  Has patient had a PCN reaction causing immediate rash, facial/tongue/throat swelling, SOB or lightheadedness with hypotension: unknown Has patient had a PCN reaction causing severe rash involving mucus membranes or skin necrosis: unknown Has patient had a PCN reaction that required hospitalization " unknown Has patient had a PCN reaction occurring within the last 10 years: unknown If all of the above answers are "NO", then may proceed with Cephalosporin use.       Arlean Hoppingorey M. Newman PiesBall, PharmD, BCPS Clinical Pharmacist [phone] 04/19/2016 6:12 PM

## 2016-04-19 NOTE — ED Notes (Signed)
Second peripheral IV removed by patient. Catheter intact.

## 2016-04-19 NOTE — ED Provider Notes (Signed)
[facility]-EMERGENCY DEPT Provider Note   CSN: [id number] Arrival date & time: 04/19/16 1629     History    Chief Complaint  Patient presents with  . Altered Mental Status  . Hypoglycemia     HPI Austin Carney is a 51 y.o. male.  51yo M w/ PMH including dementia, Parkinson's disease, COPD, T2DM who p/w Hypoglycemia and fall. Pt brought in by EMS from his assisted living facility after he had a witnessed fall in the hallway. Staff reported that he did not hit his head and did not have any loss of consciousness or obvious injuries from the fall. They noted that he was not following commands and seemed to be sleepy. Blood glucose was 58 by nursing staff and they gave him oral glucose. Repeat blood glucose was 120 by EMS. They noted borderline hypotension at 100/60.  LEVEL 5 CAVEAT DUE TO DEMENTIA   Past Medical History:  Diagnosis Date  . Bipolar 1 disorder (HCC)   . BPH (benign prostatic hyperplasia)   . COPD (chronic obstructive pulmonary disease) (HCC)   . Dementia   . DM (diabetes mellitus) (HCC)   . Hyperlipidemia   . Parkinson disease (HCC)   . Seasonal allergic rhinitis   . Seizures (HCC)      There are no active problems to display for this patient.   Past Surgical History:  Procedure Laterality Date  . BRAIN SURGERY          Home Medications    Prior to Admission medications   Medication Sig Start Date End Date Taking? Authorizing Provider  benztropine (COGENTIN) 0.5 MG tablet Take 0.5 mg by mouth at bedtime.   Yes Historical Provider, MD  benztropine (COGENTIN) 1 MG tablet Take 1 mg by mouth daily.   Yes Historical Provider, MD  busPIRone (BUSPAR) 15 MG tablet Take 15 mg by mouth 4 (four) times daily. 9am, 12pm, 4pm, 8pm   Yes Historical Provider, MD  divalproex (DEPAKOTE SPRINKLE) 125 MG capsule Take 125 mg by mouth 3 (three) times daily.   Yes Historical Provider, MD  haloperidol (HALDOL) 5 MG tablet Take 15 mg by mouth 2 (two) times daily.   Yes  Historical Provider, MD  levETIRAcetam (KEPPRA) 250 MG tablet Take 250 mg by mouth 2 (two) times daily.   Yes Historical Provider, MD  lithium 300 MG tablet Take 600 mg by mouth 2 (two) times daily.   Yes Historical Provider, MD  simvastatin (ZOCOR) 10 MG tablet Take 10 mg by mouth at bedtime.   Yes Historical Provider, MD  Skin Protectants, Misc. (EUCERIN) cream Apply 1 application topically daily.   Yes Historical Provider, MD  tamsulosin (FLOMAX) 0.4 MG CAPS capsule Take 1 capsule (0.4 mg total) by mouth daily. 11/27/12  Yes Roxy Horseman, PA-C  albuterol (PROVENTIL HFA;VENTOLIN HFA) 108 (90 BASE) MCG/ACT inhaler Inhale 2 puffs into the lungs every 2 (two) hours as needed for wheezing or shortness of breath (cough). Patient not taking: Reported on 04/16/2016 03/12/13   Junious Silk, PA-C      History reviewed. No pertinent family history.   Social History  Substance Use Topics  . Smoking status: Former Games developer  . Smokeless tobacco: Never Used  . Alcohol use No     Allergies     Penicillins    Review of Systems  Unable to obtain ROS 2/2 dementia   Physical Exam Updated Vital Signs BP 129/73   Pulse 69   Temp 97.8 F (36.6 C) (Oral)  Resp 16   Ht 5\' 11"  (1.803 m)   Wt 180 lb (81.6 kg)   SpO2 100%   BMI 25.10 kg/m   Physical Exam  Constitutional: He appears well-developed and well-nourished. No distress.  Sleepy, confused  HENT:  Head: Normocephalic and atraumatic.  dry mucous membranes  Eyes: Conjunctivae are normal. Pupils are equal, round, and reactive to light.  Neck: Neck supple.  Cardiovascular: Normal rate, regular rhythm and normal heart sounds.   No murmur heard. Pulmonary/Chest: Effort normal and breath sounds normal.  Abdominal: Soft. Bowel sounds are normal. He exhibits no distension. There is no tenderness.  Musculoskeletal: He exhibits no edema.  Neurological:  Sleepy, oriented to person only, non-sensical speech, 5/5 strength x all 4  extremities, no facial asymmetry, unable to complete finger to nose testing  Skin: Skin is warm and dry.  Nursing note and vitals reviewed.     ED Treatments / Results  Labs (all labs ordered are listed, but only abnormal results are displayed) Labs Reviewed  COMPREHENSIVE METABOLIC PANEL - Abnormal; Notable for the following:       Result Value   CO2 21 (*)    Glucose, Bld 167 (*)    BUN 21 (*)    Creatinine, Ser 1.58 (*)    Total Protein 6.0 (*)    Albumin 3.3 (*)    ALT 12 (*)    GFR calc non Af Amer 49 (*)    GFR calc Af Amer 56 (*)    All other components within normal limits  CBC WITH DIFFERENTIAL/PLATELET - Abnormal; Notable for the following:    WBC 11.7 (*)    RBC 4.09 (*)    Hemoglobin 11.3 (*)    HCT 34.8 (*)    Neutro Abs 9.6 (*)    All other components within normal limits  URINALYSIS, ROUTINE W REFLEX MICROSCOPIC - Abnormal; Notable for the following:    Glucose, UA 50 (*)    All other components within normal limits  CBG MONITORING, ED - Abnormal; Notable for the following:    Glucose-Capillary 58 (*)    All other components within normal limits  I-STAT CG4 LACTIC ACID, ED - Abnormal; Notable for the following:    Lactic Acid, Venous 2.01 (*)    All other components within normal limits  CBG MONITORING, ED - Abnormal; Notable for the following:    Glucose-Capillary 122 (*)    All other components within normal limits  CBG MONITORING, ED - Abnormal; Notable for the following:    Glucose-Capillary >600 (*)    All other components within normal limits  CULTURE, BLOOD (ROUTINE X 2)  CULTURE, BLOOD (ROUTINE X 2)  URINE CULTURE  TSH  LITHIUM LEVEL  T4, FREE  CBG MONITORING, ED     EKG  EKG Interpretation  Date/Time:    Ventricular Rate:    PR Interval:    QRS Duration:   QT Interval:    QTC Calculation:   R Axis:     Text Interpretation:           Radiology Dg Chest 2 View  Result Date: 04/19/2016 CLINICAL DATA:  Confusion EXAM:  CHEST  2 VIEW COMPARISON:  09/11/2012 FINDINGS: Cardiac shadow is mildly enlarged. The overall inspiratory effort is poor. No focal infiltrate or sizable effusion is seen. No bony abnormality is noted. IMPRESSION: No active cardiopulmonary disease. Electronically Signed   By: Alcide CleverMark  Lukens M.D.   On: 04/19/2016 17:46    Procedures Procedures (including  critical care time) .Critical Care Performed by: Laurence Spates Authorized by: Laurence Spates   Critical care provider statement:    Critical care time (minutes):  35   Critical care time was exclusive of:  Separately billable procedures and treating other patients   Critical care was necessary to treat or prevent imminent or life-threatening deterioration of the following conditions:  Endocrine crisis   Critical care was time spent personally by me on the following activities:  Evaluation of patient's response to treatment, examination of patient, obtaining history from patient or surrogate, ordering and performing treatments and interventions, ordering and review of laboratory studies, ordering and review of radiographic studies, re-evaluation of patient's condition and review of old charts    Medications Ordered in ED  Medications  dextrose 5 %-0.9 % sodium chloride infusion ( Intravenous New Bag/Given 04/19/16 1813)  dextrose 50 % solution 50 mL (not administered)  vancomycin (VANCOCIN) 1,250 mg in sodium chloride 0.9 % 250 mL IVPB (1,250 mg Intravenous New Bag/Given 04/19/16 1908)    Followed by  vancomycin (VANCOCIN) IVPB 750 mg/150 ml premix (not administered)  levofloxacin (LEVAQUIN) IVPB 750 mg (750 mg Intravenous New Bag/Given 04/19/16 1908)  sodium chloride 0.9 % bolus 1,000 mL (1,000 mLs Intravenous New Bag/Given 04/19/16 1649)  dextrose 50 % solution 50 mL (50 mLs Intravenous Given 04/19/16 1649)     Initial Impression / Assessment and Plan / ED Course  I have reviewed the triage vital signs and the nursing  notes.  Pertinent labs & imaging results that were available during my care of the patient were reviewed by me and considered in my medical decision making (see chart for details).      Pt w/ known dementia, Parkinson's, T2DM p/w confusion and hypoglycemia, fall without any head injury or LOC per staff witnesses. On exam he was sleepy but arousable to voice, no acute distress. BP mildly low at 94/59, afebrile. Normal heart rate. He was able to demonstrate normal strength x all 4 extremities. BP blood glucose after oral glucose was 58, therefore gave  an amp of D50 as well as fluid bolus. Obtained above lab work. I reviewed workup from a few days ago when he presented with a similar complaint of confusion and hypoglycemia. His head CT at that time was negative acute and he has not had any head trauma today therefore I do not feel he needs repeat imaging. Obtained chest x-ray to evaluate for infectious process.  CXR negative acute. UA without infection. Lactate mildly elevated at 2.01. Mild AKI w/ Cr 1.58. WBC 11.7. The Patient continued to have persistent hypoglycemia despite drinking juice twice and initial D50, gave second dose of D50 and initiated D5 drip. Given his mild lactic acidosis, mild leukocytosis, and persistent hypoglycemia, I treated for sepsis w/ vanc and levaquin per pharmacy recs 2/2 PCN allergy. His mental status has been unchanged during ED course. Blood pressure and heart rate remained normal. Discussed admission with family medicine, Dr. Abelardo Diesel, and pt admitted to Dr. McDermit's service for further care.  Final Clinical Impressions(s) / ED Diagnoses   Final diagnoses:  Altered mental status, unspecified altered mental status type  Hypoglycemia     New Prescriptions   No medications on file       Laurence Spates, MD 04/19/16 1931

## 2016-04-19 NOTE — H&P (Signed)
Family Medicine Teaching [facility] Admission History and Physical Service Pager: [phone]  Patient name: Austin Carney Medical record number: [id number] Date of birth: 05-02-1965 Age: 51 y.o. Gender: male  Primary Care Provider: PROVIDER NOT IN SYSTEM Consultants: None Code Status: Presumed Full  Chief Complaint: Acute Encephalopathy   Assessment and Plan: Austin Carney is a 51 y.o. male presenting with acute encephalopathy. PMH is significant for Parkinson's dementia, seizure disorder, bipolar 1 disorder, BPH, COPD, DM, HLD, HTN.  Acute Encephalopathy. Broad differential. Likely secondary to hypoglycemia, though would expect some improvement with improving CBGs. No other significant electrolyte abnormalities. TSH normal. Valproate level within normal limits. Head CT with stable hydrocephalus - no signs of mass effect or bleed. No fever to suggest infectious cause. Differential also includes ongoing seizure, postictal state s/p seizure, medication induced (patient on haldol, keppra, and valproate). Also consider adrenal insufficiency given concurrent hypoglycemia, though less likely.  - Check UDS, ethanol level - Check thiamine level - Consider EEG to rule out status epilepticus  - Blood cultures and urine cultures pending.  - Keppra level pending  - Vanc and levaquin started in ED, would keep on pending negative cultures.  - Attempt to obtain records from patient's PCP and/or ALF to verify medications and baseline mental status  Hypoglycemia. Unclear etiology. Possibly related to poor PO intake. Patient may be on antihyperglycemic - though does not have any on his medication list. Mild leukocytosis, but no other signs of infection. Insulinoma and adrenal insufficiency also possible, though less likely. Doubt sepsis given lack of fever, though still must be considered.  - Continue D5NS until CBGs stabilize - Monitor CBGs q1-2 hours - Check AM cortisol - Consider C-peptide,  insulin levels etc to rule out insulinoma  - Antibiotics as above  AKI. Cr 1.58. Baseline <1. Likely prerenal  - IVF - Trend BMP - Avoid nephrotoxic medications  Lactic Acidosis. 2.01 on admission. Improved to 1.03 with IVF. - Monitor  Seizure Disorder. On depakote 125mg  tid, keppra 250mg  bid. Depakote within therapeutic range - Hold home antiepileptics while altered - Keppra level pending - Consider EEG if mental status not improving.   Bipolar 1 Disorder.  On haldol 15mg  bid and lithium 600mg  bid. Lithium level within normal ranges - Hold home medications while altered.  HTN/HLD. On simvastatin as outpatient. - Hold home medications while altered.   FEN/GI: NPO until mental status clears. D5NS@125cc /hr Prophylaxis: Lovenox  Disposition: Placed in observation pending above management.   History of Present Illness:  Austin Carney is a 51 y.o. male presenting with acute encephalopathy. History is provided by chart review as patient is unable to provide history due to mental status.   Per report, patient was brought to the ED earlier this afternoon by EMS after having a witnessed fall. En route to the ED, he was noted to have a CBG of 58 and was given oral glucose. This improved to 120 on recheck by EMS.   In the ED, patient was again noted to be hypoglycemic to 58 and was given juice and D50 as well as being started on a D5 infusion. His CBGs improved to the low 100s. Given his persistent hypoglycemia, patient was started on vanc and levaquin for sepsis coverage.   Review Of Systems: Not obtained due to mental status ROS  Patient Active Problem List   Diagnosis Date Noted  . Altered mental status 04/19/2016    Past Medical History: Past Medical History:  Diagnosis Date  .  Bipolar 1 disorder (HCC)   . BPH (benign prostatic hyperplasia)   . COPD (chronic obstructive pulmonary disease) (HCC)   . Dementia   . DM (diabetes mellitus) (HCC)   . Hyperlipidemia   .  Parkinson disease (HCC)   . Seasonal allergic rhinitis   . Seizures (HCC)     Past Surgical History: Past Surgical History:  Procedure Laterality Date  . BRAIN SURGERY      Social History: Social History  Substance Use Topics  . Smoking status: Former Games developermoker  . Smokeless tobacco: Never Used  . Alcohol use No   Additional social history: Lives at ALF Please also refer to relevant sections of EMR.  Family History: History reviewed. No pertinent family history.  Allergies and Medications: Allergies  Allergen Reactions  . Penicillins Other (See Comments)    Unknown- on MAR  Has patient had a PCN reaction causing immediate rash, facial/tongue/throat swelling, SOB or lightheadedness with hypotension: unknown Has patient had a PCN reaction causing severe rash involving mucus membranes or skin necrosis: unknown Has patient had a PCN reaction that required hospitalization " unknown Has patient had a PCN reaction occurring within the last 10 years: unknown If all of the above answers are "NO", then may proceed with Cephalosporin use.     No current facility-administered medications on file prior to encounter.    Current Outpatient Prescriptions on File Prior to Encounter  Medication Sig Dispense Refill  . benztropine (COGENTIN) 0.5 MG tablet Take 0.5 mg by mouth at bedtime.    . benztropine (COGENTIN) 1 MG tablet Take 1 mg by mouth daily.    . busPIRone (BUSPAR) 15 MG tablet Take 15 mg by mouth 4 (four) times daily. 9am, 12pm, 4pm, 8pm    . divalproex (DEPAKOTE SPRINKLE) 125 MG capsule Take 125 mg by mouth 3 (three) times daily.    . haloperidol (HALDOL) 5 MG tablet Take 15 mg by mouth 2 (two) times daily.    Austin Carney. levETIRAcetam (KEPPRA) 250 MG tablet Take 250 mg by mouth 2 (two) times daily.    . simvastatin (ZOCOR) 10 MG tablet Take 10 mg by mouth at bedtime.    . Skin Protectants, Misc. (EUCERIN) cream Apply 1 application topically daily.    . tamsulosin (FLOMAX) 0.4 MG CAPS  capsule Take 1 capsule (0.4 mg total) by mouth daily. 30 capsule 0  . albuterol (PROVENTIL HFA;VENTOLIN HFA) 108 (90 BASE) MCG/ACT inhaler Inhale 2 puffs into the lungs every 2 (two) hours as needed for wheezing or shortness of breath (cough). (Patient not taking: Reported on 04/16/2016) 1 Inhaler 0    Objective: BP 127/74   Pulse 66   Temp 97.8 F (36.6 C) (Oral)   Resp 17   Ht 5\' 11"  (1.803 m)   Wt 180 lb (81.6 kg)   SpO2 100%   BMI 25.10 kg/m  Exam: General: 50yo male lying in hospital bed, eyes closed Eyes:  EOMI ENTM: MMM Neck: Supple, FROM Cardiovascular: RRR, no murmurs noted Respiratory: NWOB, CTAB Gastrointestinal: +BS, S, NT, ND MSK: No cyanosis or edema Derm: Warm, dry Neuro: Somnolent. Opens eyes to verbal stimuli. Responds appropriately to verbal commands. Moves all extremities sponaneously. Not oriented to place, person, or year. Slow to respond to verbal stimuli. Tremor of left had noted.   Labs and Imaging: CBC BMET   Recent Labs Lab 04/19/16 1717  WBC 11.7*  HGB 11.3*  HCT 34.8*  PLT 167    Recent Labs Lab 04/19/16 1717  NA  140  K 4.0  CL 111  CO2 21*  BUN 21*  CREATININE 1.58*  GLUCOSE 167*  CALCIUM 9.1      Recent Labs Lab 04/19/16 1635 04/19/16 1726 04/19/16 1808 04/19/16 1901 04/19/16 1954  GLUCAP 58* 122* 77 >600* 105*   TSH 1.17 Lactate 2.01 > 1.03 Lithium 0.97 Valproate 57  Urinalysis    Component Value Date/Time   COLORURINE YELLOW 04/19/2016 1816   APPEARANCEUR CLEAR 04/19/2016 1816   APPEARANCEUR Clear 10/06/2012 0159   LABSPEC 1.008 04/19/2016 1816   LABSPEC 1.010 10/06/2012 0159   PHURINE 6.0 04/19/2016 1816   GLUCOSEU 50 (A) 04/19/2016 1816   GLUCOSEU Negative 10/06/2012 0159   HGBUR NEGATIVE 04/19/2016 1816   BILIRUBINUR NEGATIVE 04/19/2016 1816   BILIRUBINUR Negative 10/06/2012 0159   KETONESUR NEGATIVE 04/19/2016 1816   PROTEINUR NEGATIVE 04/19/2016 1816   UROBILINOGEN 1.0 12/20/2012 2054   NITRITE  NEGATIVE 04/19/2016 1816   LEUKOCYTESUR NEGATIVE 04/19/2016 1816   LEUKOCYTESUR Negative 10/06/2012 0159   EKG: NSR, no acute signs of ischemia  Dg Chest 2 View  Result Date: 04/19/2016 CLINICAL DATA:  Confusion EXAM: CHEST  2 VIEW COMPARISON:  09/11/2012 FINDINGS: Cardiac shadow is mildly enlarged. The overall inspiratory effort is poor. No focal infiltrate or sizable effusion is seen. No bony abnormality is noted. IMPRESSION: No active cardiopulmonary disease. Electronically Signed   By: Alcide Clever M.D.   On: 04/19/2016 17:46   Ct Head Wo Contrast  Result Date: 04/19/2016 CLINICAL DATA:  Altered mental status EXAM: CT HEAD WITHOUT CONTRAST TECHNIQUE: Contiguous axial images were obtained from the base of the skull through the vertex without intravenous contrast. COMPARISON:  04/16/2016 FINDINGS: Brain: Ventricular dilatation is noted stable from the prior exam. No findings to suggest acute hemorrhage, acute infarction or space-occupying mass lesion are noted. Vascular: No hyperdense vessel or unexpected calcification. 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 Sinuses/Orbits: No acute finding. Other: None. IMPRESSION: Stable ventricular enlargement.  No acute abnormality is noted. Electronically Signed   By: Alcide Clever M.D.   On: 04/19/2016 20:49   Ardith Dark, MD 04/19/2016, 9:46 PM PGY-3, [facility] FPTS Intern pager: [phone], text pages welcome

## 2016-04-20 ENCOUNTER — Encounter (HOSPITAL_COMMUNITY): Payer: Self-pay | Admitting: Family Medicine

## 2016-04-20 DIAGNOSIS — R4182 Altered mental status, unspecified: Secondary | ICD-10-CM | POA: Diagnosis present

## 2016-04-20 DIAGNOSIS — F319 Bipolar disorder, unspecified: Secondary | ICD-10-CM | POA: Diagnosis present

## 2016-04-20 DIAGNOSIS — Z79899 Other long term (current) drug therapy: Secondary | ICD-10-CM | POA: Diagnosis not present

## 2016-04-20 DIAGNOSIS — G40909 Epilepsy, unspecified, not intractable, without status epilepticus: Secondary | ICD-10-CM | POA: Diagnosis present

## 2016-04-20 DIAGNOSIS — F0390 Unspecified dementia without behavioral disturbance: Secondary | ICD-10-CM | POA: Diagnosis present

## 2016-04-20 DIAGNOSIS — Z8669 Personal history of other diseases of the nervous system and sense organs: Secondary | ICD-10-CM

## 2016-04-20 DIAGNOSIS — N251 Nephrogenic diabetes insipidus: Secondary | ICD-10-CM | POA: Diagnosis present

## 2016-04-20 DIAGNOSIS — E785 Hyperlipidemia, unspecified: Secondary | ICD-10-CM | POA: Diagnosis present

## 2016-04-20 DIAGNOSIS — Z87891 Personal history of nicotine dependence: Secondary | ICD-10-CM | POA: Diagnosis not present

## 2016-04-20 DIAGNOSIS — G20 Parkinson's disease: Secondary | ICD-10-CM | POA: Diagnosis present

## 2016-04-20 DIAGNOSIS — F0280 Dementia in other diseases classified elsewhere without behavioral disturbance: Secondary | ICD-10-CM | POA: Diagnosis present

## 2016-04-20 DIAGNOSIS — T50905A Adverse effect of unspecified drugs, medicaments and biological substances, initial encounter: Secondary | ICD-10-CM

## 2016-04-20 DIAGNOSIS — I10 Essential (primary) hypertension: Secondary | ICD-10-CM | POA: Diagnosis present

## 2016-04-20 DIAGNOSIS — N179 Acute kidney failure, unspecified: Secondary | ICD-10-CM | POA: Diagnosis present

## 2016-04-20 DIAGNOSIS — G9340 Encephalopathy, unspecified: Secondary | ICD-10-CM | POA: Diagnosis present

## 2016-04-20 DIAGNOSIS — E860 Dehydration: Secondary | ICD-10-CM | POA: Diagnosis present

## 2016-04-20 DIAGNOSIS — E11649 Type 2 diabetes mellitus with hypoglycemia without coma: Secondary | ICD-10-CM | POA: Diagnosis present

## 2016-04-20 DIAGNOSIS — E162 Hypoglycemia, unspecified: Secondary | ICD-10-CM | POA: Diagnosis not present

## 2016-04-20 DIAGNOSIS — E118 Type 2 diabetes mellitus with unspecified complications: Secondary | ICD-10-CM | POA: Diagnosis not present

## 2016-04-20 DIAGNOSIS — Z880 Allergy status to penicillin: Secondary | ICD-10-CM | POA: Diagnosis not present

## 2016-04-20 DIAGNOSIS — E861 Hypovolemia: Secondary | ICD-10-CM | POA: Diagnosis present

## 2016-04-20 DIAGNOSIS — D72829 Elevated white blood cell count, unspecified: Secondary | ICD-10-CM | POA: Diagnosis present

## 2016-04-20 DIAGNOSIS — N400 Enlarged prostate without lower urinary tract symptoms: Secondary | ICD-10-CM | POA: Diagnosis present

## 2016-04-20 DIAGNOSIS — E872 Acidosis: Secondary | ICD-10-CM | POA: Diagnosis present

## 2016-04-20 DIAGNOSIS — J449 Chronic obstructive pulmonary disease, unspecified: Secondary | ICD-10-CM | POA: Diagnosis present

## 2016-04-20 LAB — CBC
HCT: 36.1 % — ABNORMAL LOW (ref 39.0–52.0)
Hemoglobin: 12 g/dL — ABNORMAL LOW (ref 13.0–17.0)
MCH: 28 pg (ref 26.0–34.0)
MCHC: 33.2 g/dL (ref 30.0–36.0)
MCV: 84.1 fL (ref 78.0–100.0)
PLATELETS: 175 10*3/uL (ref 150–400)
RBC: 4.29 MIL/uL (ref 4.22–5.81)
RDW: 13.1 % (ref 11.5–15.5)
WBC: 9.3 10*3/uL (ref 4.0–10.5)

## 2016-04-20 LAB — GLUCOSE, CAPILLARY
GLUCOSE-CAPILLARY: 100 mg/dL — AB (ref 65–99)
GLUCOSE-CAPILLARY: 109 mg/dL — AB (ref 65–99)
GLUCOSE-CAPILLARY: 121 mg/dL — AB (ref 65–99)
GLUCOSE-CAPILLARY: 97 mg/dL (ref 65–99)
Glucose-Capillary: 90 mg/dL (ref 65–99)
Glucose-Capillary: 107 mg/dL — ABNORMAL HIGH (ref 65–99)
Glucose-Capillary: 124 mg/dL — ABNORMAL HIGH (ref 65–99)
Glucose-Capillary: 138 mg/dL — ABNORMAL HIGH (ref 65–99)

## 2016-04-20 LAB — RAPID URINE DRUG SCREEN, HOSP PERFORMED
Amphetamines: NOT DETECTED
BARBITURATES: NOT DETECTED
BENZODIAZEPINES: NOT DETECTED
COCAINE: NOT DETECTED
Opiates: NOT DETECTED
TETRAHYDROCANNABINOL: NOT DETECTED

## 2016-04-20 LAB — COMPREHENSIVE METABOLIC PANEL
ALK PHOS: 51 U/L (ref 38–126)
ALT: 13 U/L — AB (ref 17–63)
AST: 18 U/L (ref 15–41)
Albumin: 3.4 g/dL — ABNORMAL LOW (ref 3.5–5.0)
Anion gap: 9 (ref 5–15)
BUN: 12 mg/dL (ref 6–20)
CALCIUM: 9.2 mg/dL (ref 8.9–10.3)
CO2: 23 mmol/L (ref 22–32)
CREATININE: 1.24 mg/dL (ref 0.61–1.24)
Chloride: 111 mmol/L (ref 101–111)
Glucose, Bld: 110 mg/dL — ABNORMAL HIGH (ref 65–99)
Potassium: 3.6 mmol/L (ref 3.5–5.1)
Sodium: 143 mmol/L (ref 135–145)
Total Bilirubin: 0.8 mg/dL (ref 0.3–1.2)
Total Protein: 6.4 g/dL — ABNORMAL LOW (ref 6.5–8.1)

## 2016-04-20 LAB — MRSA PCR SCREENING: MRSA by PCR: NEGATIVE

## 2016-04-20 LAB — CORTISOL-AM, BLOOD: Cortisol - AM: 18.8 ug/dL (ref 6.7–22.6)

## 2016-04-20 MED ORDER — HALOPERIDOL LACTATE 2 MG/ML PO CONC: 2.0000 mg | Freq: Once | ORAL | Status: DC

## 2016-04-20 MED ORDER — HALOPERIDOL 5 MG PO TABS
15.0000 mg | ORAL_TABLET | Freq: Two times a day (BID) | ORAL | Status: DC
  Administered 2016-04-20 – 2016-04-22 (×5): 15 mg via ORAL
  Filled 2016-04-20 (×6): qty 3

## 2016-04-20 MED ORDER — LITHIUM CARBONATE 300 MG PO CAPS
600.0000 mg | ORAL_CAPSULE | Freq: Two times a day (BID) | ORAL | Status: DC
  Administered 2016-04-20 – 2016-04-22 (×5): 600 mg via ORAL
  Filled 2016-04-20 (×6): qty 2

## 2016-04-20 MED ORDER — HALOPERIDOL LACTATE 5 MG/ML IJ SOLN
2.0000 mg | Freq: Once | INTRAMUSCULAR | Status: AC
  Administered 2016-04-20: 2 mg via INTRAVENOUS
  Filled 2016-04-20: qty 1

## 2016-04-20 MED ORDER — DIVALPROEX SODIUM 125 MG PO CSDR
125.0000 mg | DELAYED_RELEASE_CAPSULE | Freq: Three times a day (TID) | ORAL | Status: DC
  Administered 2016-04-20 – 2016-04-22 (×7): 125 mg via ORAL
  Filled 2016-04-20 (×8): qty 1

## 2016-04-20 NOTE — Progress Notes (Signed)
Pt is disoriented and confused, cannot stay put. Gets up and walks in the room, gait is unsteady at times. Needs a lot of reorientation. Frequent reminders done. Bed alarm and chair alarms used for pt's safety. 1600 Tele sitter provided for pt's safety.

## 2016-04-20 NOTE — Progress Notes (Signed)
[facility]upervisor [facility] She reviewed patient's medications with me, and they were the same as those available under home medications in Epic. She reports he used to get his sugars checked, but this was stopped several months ago because they were good. She reports he has not been on any medications recently to control blood sugar. Regarding baseline mental status, she says patient does have a poor memory and needs to be reminded of the date. However, lately he has needed to be reminded of things more. He typically knows where he is and, for example, rushed to the front desk asking where he was the other day. He has had more trouble participating in Adult Day Program due to increased confusion. She also advised me that patients normally cannot return to ALF over the weekend if any changes to their medication/monitoring have been made, as the Nurse Manager needs to review these changes with Social Work on a weekday.   Dani GobbleHillary Fitzgerald, MD [facility], PGY-2

## 2016-04-20 NOTE — Progress Notes (Signed)
FPTS Interim Progress Note  Called that patient was agitated, trying to get out of bed repeatedly, asking for a drink of water, keeps trying to get up to go to the bathroom. Patient seen and examined at bedside. He seems to be altered, although unknown baseline.  Repeatedly says "I know the brother that brought me here." Tremulous c/w parkinsonism. When I'm in the room he pulls the covers up over his shoulder and keeps repeating those words. Does not respond to questions appropriately, AAOx1.  Unclear if patient is responding to internal stimuli. - POC CBG 90, continue to monitor q2H - vitals stable - Bedside swallow passed - patient can have a glass of water - will give Haldol 2 mg x1, monitor mentation (patient takes 15 mg haldol BID at home)  Howard PouchLauren Austen Oyster, MD 04/20/2016, 2:04 AM PGY-1, [facility] pager [phone]

## 2016-04-20 NOTE — Progress Notes (Signed)
Family Medicine Teaching Service Daily Progress Note Intern Pager: [phone]  Patient name: Austin Carney Medical record number: [id number] Date of birth: 1966-01-31 Age: 51 y.o. Gender: male  Primary Care Provider: PROVIDER NOT IN SYSTEM Consultants: None Code Status: Full (assumed)  Pt Overview and Major Events to Date:  2/10 - Admitted for AMS  Assessment and Plan: Austin Carney is a 51 y.o. male presenting with acute encephalopathy. PMH is significant for Parkinson's dementia, seizure disorder, bipolar 1 disorder, BPH, COPD, DM, HLD, HTN.  Acute Encephalopathy. Broad differential. Likely secondary to hypoglycemia, though would expect some improvement with improving CBGs. No other significant electrolyte abnormalities. TSH normal. Valproate level within normal limits. Head CT with stable hydrocephalus - no signs of mass effect or bleed. No fever to suggest infectious cause. Differential also includes ongoing seizure, postictal state s/p seizure, medication induced (patient on haldol, keppra, and valproate). Also consider adrenal insufficiency given concurrent hypoglycemia, though less likely. Valproic acid WNL at 57. Ethanol level negative. - Check UDS, collected - Check thiamine level -- in process - EEG ordered to rule out status epilepticus  - Blood cultures and urine cultures pending.  - Keppra level pending  - Vanc and levaquin started in ED, would keep on pending negative cultures.  - Attempt to obtain records from patient's PCP and/or ALF to verify medications and baseline mental status  Hypoglycemia. Unclear etiology. Possibly related to poor PO intake. Patient may be on antihyperglycemic - though does not have any on his medication list. Mild leukocytosis, but no other signs of infection. Insulinoma and adrenal insufficiency also possible, though less likely. Doubt sepsis given lack of fever, though still must be considered. AM cortisol normal at 18.8.  - Continue D5NS  until CBGs stabilize - Monitor CBGs qACHS now that he is eating - Consider C-peptide, insulin levels etc to rule out insulinoma  - Antibiotics as above  AKI. Cr 1.24, improved from 1.58. Baseline <1. Likely prerenal  - IVF - Trend BMP - Avoid nephrotoxic medications  Lactic Acidosis. 2.01 on admission. Improved to 1.03 with IVF. - Monitor  Seizure Disorder. On depakote 125mg  tid, keppra 250mg  bid. Depakote within therapeutic range - Restart home depakote since more alert - Keppra level pending; restart if normal - Ordered EEG as patient still altered this a.m.  Bipolar 1 Disorder.  On haldol 15mg  bid and lithium 600mg  bid. Lithium level within normal ranges - Restart haldol and lithium, as WNL  HTN/HLD. On simvastatin as outpatient. - Restart simvastatin, as more alert.   FEN/GI: Regular diet, as passed bedside swallow test. D5NS decreased to 50 cc/hr Prophylaxis: Lovenox  Disposition: Pending improvement in mental status. Will need to obtain more information from ALF.  Subjective:  Patient very pleasant this morning, speaking about being redeemed and asking this provider to write him a poem. Not aware that he is in the hospital or [place]. Ate his entire breakfast.   Objective: Temp:  [97.3 F (36.3 C)-97.8 F (36.6 C)] 97.3 F (36.3 C) (02/11 0607) Pulse Rate:  [65-103] 66 (02/11 0607) Resp:  [12-25] 18 (02/11 0607) BP: (94-129)/(59-77) 106/76 (02/11 0607) SpO2:  [92 %-100 %] 98 % (02/11 0607) Weight:  [180 lb (81.6 kg)-268 lb 8 oz (121.8 kg)] 268 lb 8 oz (121.8 kg) (02/10 2251) Physical Exam: Eyes:  Bilateral cataracts  ENTM: MMM Neck: Supple, FROM Cardiovascular: RRR, no murmurs noted Respiratory: NWOB, CTAB Gastrointestinal: +BS, S, NT, ND MSK: No cyanosis or edema Derm: Warm, dry Neuro: Alert  but oriented only to self. Follows commands. Could touch nose when asked but became too distracted to then touch provider's finger. Moves all extremities  sponaneously. Tremor of left had noted.   Laboratory:  Recent Labs Lab 04/16/16 1308 04/19/16 1717 04/20/16 0555  WBC 9.1 11.7* 9.3  HGB 13.0 11.3* 12.0*  HCT 39.4 34.8* 36.1*  PLT 185 167 175    Recent Labs Lab 04/16/16 1308 04/19/16 1717 04/20/16 0555  NA 142 140 143  K 4.0 4.0 3.6  CL 108 111 111  CO2 31 21* 23  BUN 15 21* 12  CREATININE 1.36* 1.58* 1.24  CALCIUM 9.6 9.1 9.2  PROT 7.8 6.0* 6.4*  BILITOT 0.7 0.4 0.8  ALKPHOS 53 49 51  ALT 10* 12* 13*  AST 14* 22 18  GLUCOSE 79 167* 110*    Imaging/Diagnostic Tests: Dg Chest 2 View  Result Date: 04/19/2016 CLINICAL DATA:  Confusion EXAM: CHEST  2 VIEW COMPARISON:  09/11/2012 FINDINGS: Cardiac shadow is mildly enlarged. The overall inspiratory effort is poor. No focal infiltrate or sizable effusion is seen. No bony abnormality is noted. IMPRESSION: No active cardiopulmonary disease. Electronically Signed   By: Alcide CleverMark  Lukens M.D.   On: 04/19/2016 17:46   Ct Head Wo Contrast  Result Date: 04/19/2016 CLINICAL DATA:  Altered mental status EXAM: CT HEAD WITHOUT CONTRAST TECHNIQUE: Contiguous axial images were obtained from the base of the skull through the vertex without intravenous contrast. COMPARISON:  04/16/2016 FINDINGS: Brain: Ventricular dilatation is noted stable from the prior exam. No findings to suggest acute hemorrhage, acute infarction or space-occupying mass lesion are noted. Vascular: No hyperdense vessel or unexpected calcification. Skull: Ines BloomerBurr hole is again noted in the right frontal region stable from the previous exam. Sinuses/Orbits: No acute finding. Other: None. IMPRESSION: Stable ventricular enlargement.  No acute abnormality is noted. Electronically Signed   By: Alcide CleverMark  Lukens M.D.   On: 04/19/2016 20:49   Ct Head Wo Contrast  Result Date: 04/16/2016 CLINICAL DATA:  Confusion. History of Parkinson's disease and dementia EXAM: CT HEAD WITHOUT CONTRAST TECHNIQUE: Contiguous axial images were obtained from  the base of the skull through the vertex without intravenous contrast. COMPARISON:  December 20, 2012 FINDINGS: Brain: Moderate generalized ventricular prominence is stable. Sulci appear normal. There is no intracranial mass, hemorrhage, extra-axial fluid collection, or midline shift. Gray-white compartments appear normal. No acute infarct evident. Vascular: No hyperdense vessel. There is slight calcification in each carotid siphon region. Skull: Evidence of previous burr hole placement in the right frontal region, stable. Bony calvarium otherwise appears intact and unchanged. Sinuses/Orbits: Visualized paranasal sinuses are clear. Orbits appear symmetric bilaterally. Other: Mastoid air cells are clear. IMPRESSION: Persistent and stable generalized ventricular enlargement in a pattern indicative of communicating hydrocephalus. No sulcal prominence. Evidence of previous burr hole placement in the right frontal region, unchanged. Currently no drainage device seen. No intracranial mass, hemorrhage, extra-axial fluid collection. Gray-white compartments appear unremarkable and stable. Electronically Signed   By: Bretta BangWilliam  Woodruff III M.D.   On: 04/16/2016 12:03    Casey BurkittHillary Moen Fitzgerald, MD 04/20/2016, 8:52 AM PGY-2, [place] [facility] FPTS Intern pager: [phone], text pages welcome

## 2016-04-21 DIAGNOSIS — G20 Parkinson's disease: Secondary | ICD-10-CM

## 2016-04-21 DIAGNOSIS — F0281 Dementia in other diseases classified elsewhere with behavioral disturbance: Secondary | ICD-10-CM

## 2016-04-21 LAB — GLUCOSE, CAPILLARY
GLUCOSE-CAPILLARY: 103 mg/dL — AB (ref 65–99)
Glucose-Capillary: 101 mg/dL — ABNORMAL HIGH (ref 65–99)
Glucose-Capillary: 91 mg/dL (ref 65–99)
Glucose-Capillary: 103 mg/dL — ABNORMAL HIGH (ref 65–99)

## 2016-04-21 LAB — HEMOGLOBIN A1C
Hgb A1c MFr Bld: 4.7 % — ABNORMAL LOW (ref 4.8–5.6)
MEAN PLASMA GLUCOSE: 88 mg/dL

## 2016-04-21 LAB — URINE CULTURE
CULTURE: NO GROWTH
SPECIAL REQUESTS: NORMAL

## 2016-04-21 LAB — LEVETIRACETAM LEVEL: LEVETIRACETAM: 4 ug/mL — AB (ref 10.0–40.0)

## 2016-04-21 MED ORDER — VITAMIN B-1 100 MG PO TABS
100.0000 mg | ORAL_TABLET | Freq: Every day | ORAL | Status: DC
  Administered 2016-04-22: 100 mg via ORAL
  Filled 2016-04-21: qty 1

## 2016-04-21 NOTE — Discharge Summary (Signed)
Family Medicine Teaching [facility] Discharge Summary  Patient name: Austin Carney Medical record number: [id number] Date of birth: 1965-06-11 Age: 51 y.o. Gender: male Date of Admission: 04/19/2016  Date of Discharge: 04/21/2016 Admitting Physician: Austin Roach McDiarmid, MD  Primary Care Provider: PROVIDER NOT IN SYSTEM Consultants: None  Indication for Hospitalization: Altered Mental Status  Discharge Diagnoses/Problem List:  Active Problems:   Altered mental status   Diabetes mellitus    History of hydrocephalus   Drug-induced nephrogenic diabetes insipidus    Dementia   Parkinsonian syndrome   Disposition: ALF  Discharge Condition: Improved  Discharge Exam:  Blood pressure 126/76, pulse 78, temperature 98.1 F (36.7 C), temperature source Oral, resp. rate 18, height 5\' 7"  (1.702 m), weight 268 lb 8 oz (121.8 kg), SpO2 98 %. General: well nourished, well developed, in no acute distress with non-toxic appearance HEENT: normocephalic, atraumatic, moist mucous membranes, bilateral cataracts Neck: supple, non-tender without lymphadenopathy CV: regular rate and rhythm without murmurs, rubs, or gallops Lungs: clear to auscultation bilaterally with normal work of breathing Abdomen: soft, non-tender, no masses or organomegaly palpable, normoactive bowel sounds Skin: warm, dry, no rashes or lesions, cap refill < 2 seconds Extremities: warm and well perfused, normal tone Psych: Alert and oriented only to person and place, euthymic mood with congruent affect  Brief Hospital Course:  Austin Westberg Kitchenis a 51 y.o.malewho presented with acute encephalopathy.PMH is significant for Parkinson's dementia, seizure disorder, bipolar 1 disorder, BPH, COPD, DM, HLD, HTN.  Acute Encephalopathy: Unclear etiology. Work-up included checking medication levels (valproate WNL, keppra pending), ruling out change of known hydrocephalus (stable on CT), and checking blood and urine cultures to  rule-out infection (no growth to date). UDS was negative. Obtaining EEG due to history of seizures was considered but patient alert and without rhythmic motions beyond baseline tremor. He was given broad spectrum antibiotics (vanc and levaquin) for mild leukocytosis but did not decompensate when these were stopped and remained afebrile. Patient seemed back to baseline (oriented to self and place) once taking regular meals and with restarting home haldol.   Hypoglycemia: Blood sugar 58 on admission. Could be secondary to poor PO intake, though CBGs stayed in low 100s despite drinking sugary beverages and after eating meals. Interestingly, patient had glucose in his urine on admission UA. Hgb A1c 4.7 despite history of diabetes mellitus. Confirmed with ALF that patient is not on any medications for diabetes. No further episodes of hypoglycemia after starting regular diet. AM cortisol was checked and was normal, suggesting against adrenal insufficiency.   Dehydration: Had acute kidney injury that improved with IVFs. Urine very dilute, which could indicate diabetes insipidus (on lithium).   Issues for Follow Up:  1. Recommend temporarily monitoring CBGs daily and if seems altered when returns to ALF. 2. Monitor for signs of dehydration 3. Consider testing C-peptide, insulin levels in case of insulinoma causing hypoglycemia.  4. Follow-up B1, keppra levels  Significant Procedures: None  Significant Labs and Imaging:   Recent Labs Lab 04/16/16 1308 04/19/16 1717 04/20/16 0555  WBC 9.1 11.7* 9.3  HGB 13.0 11.3* 12.0*  HCT 39.4 34.8* 36.1*  PLT 185 167 175    Recent Labs Lab 04/16/16 1308 04/19/16 1717 04/20/16 0555  NA 142 140 143  K 4.0 4.0 3.6  CL 108 111 111  CO2 31 21* 23  GLUCOSE 79 167* 110*  BUN 15 21* 12  CREATININE 1.36* 1.58* 1.24  CALCIUM 9.6 9.1 9.2  ALKPHOS 53 49 51  AST 14* 22 18  ALT 10* 12* 13*  ALBUMIN 4.4 3.3* 3.4*   Dg Chest 2 View  Result Date:  04/19/2016 CLINICAL DATA:  Confusion EXAM: CHEST  2 VIEW COMPARISON:  09/11/2012 FINDINGS: Cardiac shadow is mildly enlarged. The overall inspiratory effort is poor. No focal infiltrate or sizable effusion is seen. No bony abnormality is noted. IMPRESSION: No active cardiopulmonary disease. Electronically Signed   By: Alcide CleverMark  Lukens M.D.   On: 04/19/2016 17:46   Ct Head Wo Contrast  Result Date: 04/19/2016 FINDINGS: Brain: Ventricular dilatation is noted stable from the prior exam. No findings to suggest acute hemorrhage, acute infarction or space-occupying mass lesion are noted. Vascular: No hyperdense vessel or unexpected calcification. Skull: Ines BloomerBurr hole is again noted in the right frontal region stable from the previous exam. Sinuses/Orbits: No acute finding. Other: None. IMPRESSION: Stable ventricular enlargement.  No acute abnormality is noted. Electronically Signed   By: Alcide CleverMark  Lukens M.D.   On: 04/19/2016 20:49   Ct Head Wo Contrast  Result Date: 04/16/2016 FINDINGS: Brain: Moderate generalized ventricular prominence is stable. Sulci appear normal. There is no intracranial mass, hemorrhage, extra-axial fluid collection, or midline shift. Gray-white compartments appear normal. No acute infarct evident. Vascular: No hyperdense vessel. There is slight calcification in each carotid siphon region. Skull: Evidence of previous burr hole placement in the right frontal region, stable. Bony calvarium otherwise appears intact and unchanged. Sinuses/Orbits: Visualized paranasal sinuses are clear. Orbits appear symmetric bilaterally. Other: Mastoid air cells are clear. IMPRESSION: Persistent and stable generalized ventricular enlargement in a pattern indicative of communicating hydrocephalus. No sulcal prominence. Evidence of previous burr hole placement in the right frontal region, unchanged. Currently no drainage device seen. No intracranial mass, hemorrhage, extra-axial fluid collection. Gray-white compartments  appear unremarkable and stable. Electronically Signed   By: Bretta BangWilliam  Woodruff III M.D.   On: 04/16/2016 12:03    Results/Tests Pending at Time of Discharge: Blood and urine cultures; B1 level, keppra level  Discharge Medications:  Allergies as of 04/21/2016      Reactions   Penicillins Other (See Comments)   Unknown- on MAR Has patient had a PCN reaction causing immediate rash, facial/tongue/throat swelling, SOB or lightheadedness with hypotension: unknown Has patient had a PCN reaction causing severe rash involving mucus membranes or skin necrosis: unknown Has patient had a PCN reaction that required hospitalization " unknown Has patient had a PCN reaction occurring within the last 10 years: unknown If all of the above answers are "NO", then may proceed with Cephalosporin use.        Medication List    TAKE these medications   albuterol 108 (90 Base) MCG/ACT inhaler Commonly known as:  PROVENTIL HFA;VENTOLIN HFA Inhale 2 puffs into the lungs every 2 (two) hours as needed for wheezing or shortness of breath (cough).   benztropine 1 MG tablet Commonly known as:  COGENTIN Take 1 mg by mouth daily.   benztropine 0.5 MG tablet Commonly known as:  COGENTIN Take 0.5 mg by mouth at bedtime.   busPIRone 15 MG tablet Commonly known as:  BUSPAR Take 15 mg by mouth 4 (four) times daily. 9am, 12pm, 4pm, 8pm   divalproex 125 MG capsule Commonly known as:  DEPAKOTE SPRINKLE Take 125 mg by mouth 3 (three) times daily.   eucerin cream Apply 1 application topically daily.   haloperidol 5 MG tablet Commonly known as:  HALDOL Take 15 mg by mouth 2 (two) times daily.   levETIRAcetam 250 MG tablet Commonly  known as:  KEPPRA Take 250 mg by mouth 2 (two) times daily.   lithium 300 MG tablet Take 600 mg by mouth 2 (two) times daily.   simvastatin 10 MG tablet Commonly known as:  ZOCOR Take 10 mg by mouth at bedtime.   tamsulosin 0.4 MG Caps capsule Commonly known as:  FLOMAX Take  1 capsule (0.4 mg total) by mouth daily.       Discharge Instructions: Please refer to Patient Instructions section of EMR for full details.  Patient was counseled important signs and symptoms that should prompt return to medical care, changes in medications, dietary instructions, activity restrictions, and follow up appointments.   Follow-Up Appointments:   Wendee Beavers, DO 04/21/2016, 4:28 PM PGY-1, [facility]

## 2016-04-21 NOTE — Progress Notes (Signed)
Family Medicine Teaching Service Daily Progress Note Intern Pager: [phone]  Patient name: Austin Carney Medical record number: [id number] Date of birth: 1965-04-26 Age: 51 y.o. Gender: male  Primary Care Provider: PROVIDER NOT IN SYSTEM Consultants: None Code Status: Full (assumed)  Pt Overview and Major Events to Date:  2/10: Admitted for AMS 2/12: Mentation at baseline  Assessment and Plan: Austin Carney is a 51 y.o. male presenting with acute encephalopathy. PMH is significant for Parkinson's dementia, seizure disorder, bipolar 1 disorder, BPH, COPD, DM, HLD, HTN.  #Acute Encephalopathy. Broad differential. Likely secondary to hypoglycemia, though would expect some improvement with improving CBGs. No other significant electrolyte abnormalities. TSH normal. Valproate level within normal limits. Head CT with stable hydrocephalus - no signs of mass effect or bleed. No fever to suggest infectious cause. Differential also includes ongoing seizure, postictal state s/p seizure, medication induced (patient on haldol, keppra, and valproate). Also consider adrenal insufficiency given concurrent hypoglycemia, though less likely. Valproic acid WNL at 57. Ethanol level negative. - Check UDS, collected - Check thiamine level -- in process - Blood cultures and urine cultures pending.  - Keppra level pending  - Vanc and levaquin started in ED, would keep on pending negative cultures.  - Attempt to obtain records from patient's PCP and/or ALF to verify medications and baseline mental status  #Hypoglycemia. Unclear etiology. Possibly related to poor PO intake. Patient may be on antihyperglycemic - though does not have any on his medication list. Mild leukocytosis, but no other signs of infection. Insulinoma and adrenal insufficiency also possible, though less likely. Doubt sepsis given lack of fever, though still must be considered. AM cortisol normal at 18.8.  - Continue D5NS until CBGs  stabilize - Monitor CBGs qACHS now that he is eating - Consider C-peptide, insulin levels etc to rule out insulinoma  - Antibiotics as above  #AKI. Cr 1.24, improved from 1.58. Baseline <1. Likely prerenal  - IVF - Trend BMP - Avoid nephrotoxic medications  #Lactic Acidosis. 2.01 on admission. Improved to 1.03 with IVF. - Monitor  #Seizure Disorder. On depakote 125mg  tid, keppra 250mg  bid. Depakote within therapeutic range - Restart home depakote since more alert - Keppra level pending; restart if normal - Ordered EEG as patient still altered this a.m.  #Bipolar 1 Disorder.  On haldol 15mg  bid and lithium 600mg  bid. Lithium level within normal ranges - Restart haldol and lithium, as WNL  #HTN/HLD. On simvastatin as outpatient. - Restart simvastatin, as more alert.   FEN/GI: Regular diet, as passed bedside swallow test. D5NS decreased to 50 cc/hr Prophylaxis: Lovenox  Disposition: Pending improvement in mental status. Will need to obtain more information from ALF.  Subjective:  Patient requesting to go back to facility. Says he is doing okay without complaints.  Objective: Temp:  [98 F (36.7 C)-98.2 F (36.8 C)] 98.1 F (36.7 C) (02/12 0609) Pulse Rate:  [78-80] 78 (02/12 0609) Resp:  [17-19] 18 (02/12 0609) BP: (122-133)/(70-80) 126/76 (02/12 0609) SpO2:  [98 %-99 %] 98 % (02/12 0609)   Physical Exam: General: well nourished, well developed, in no acute distress with non-toxic appearance HEENT: normocephalic, atraumatic, moist mucous membranes, bilateral cataracts Neck: supple, non-tender without lymphadenopathy CV: regular rate and rhythm without murmurs, rubs, or gallops Lungs: clear to auscultation bilaterally with normal work of breathing Abdomen: soft, non-tender, no masses or organomegaly palpable, normoactive bowel sounds Skin: warm, dry, no rashes or lesions, cap refill < 2 seconds Extremities: warm and well perfused, normal tone  Psych: Alert and  oriented only to person and place, euthymic mood with congruent affect  Laboratory:  Recent Labs Lab 04/16/16 1308 04/19/16 1717 04/20/16 0555  WBC 9.1 11.7* 9.3  HGB 13.0 11.3* 12.0*  HCT 39.4 34.8* 36.1*  PLT 185 167 175    Recent Labs Lab 04/16/16 1308 04/19/16 1717 04/20/16 0555  NA 142 140 143  K 4.0 4.0 3.6  CL 108 111 111  CO2 31 21* 23  BUN 15 21* 12  CREATININE 1.36* 1.58* 1.24  CALCIUM 9.6 9.1 9.2  PROT 7.8 6.0* 6.4*  BILITOT 0.7 0.4 0.8  ALKPHOS 53 49 51  ALT 10* 12* 13*  AST 14* 22 18  GLUCOSE 79 167* 110*    Imaging/Diagnostic Tests: Dg Chest 2 View  Result Date: 04/19/2016 CLINICAL DATA:  Confusion EXAM: CHEST  2 VIEW COMPARISON:  09/11/2012 FINDINGS: Cardiac shadow is mildly enlarged. The overall inspiratory effort is poor. No focal infiltrate or sizable effusion is seen. No bony abnormality is noted. IMPRESSION: No active cardiopulmonary disease. Electronically Signed   By: Alcide Clever M.D.   On: 04/19/2016 17:46   Ct Head Wo Contrast  Result Date: 04/19/2016 CLINICAL DATA:  Altered mental status EXAM: CT HEAD WITHOUT CONTRAST TECHNIQUE: Contiguous axial images were obtained from the base of the skull through the vertex without intravenous contrast. COMPARISON:  04/16/2016 FINDINGS: Brain: Ventricular dilatation is noted stable from the prior exam. No findings to suggest acute hemorrhage, acute infarction or space-occupying mass lesion are noted. Vascular: No hyperdense vessel or unexpected calcification. 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 Sinuses/Orbits: No acute finding. Other: None. IMPRESSION: Stable ventricular enlargement.  No acute abnormality is noted. Electronically Signed   By: Alcide Clever M.D.   On: 04/19/2016 20:49   Ct Head Wo Contrast  Result Date: 04/16/2016 CLINICAL DATA:  Confusion. History of Parkinson's disease and dementia EXAM: CT HEAD WITHOUT CONTRAST TECHNIQUE: Contiguous  axial images were obtained from the base of the skull through the vertex without intravenous contrast. COMPARISON:  December 20, 2012 FINDINGS: Brain: Moderate generalized ventricular prominence is stable. Sulci appear normal. There is no intracranial mass, hemorrhage, extra-axial fluid collection, or midline shift. Gray-white compartments appear normal. No acute infarct evident. Vascular: No hyperdense vessel. There is slight calcification in each carotid siphon region. Skull: Evidence of previous burr hole placement in the right frontal region, stable. Bony calvarium otherwise appears intact and unchanged. Sinuses/Orbits: Visualized paranasal sinuses are clear. Orbits appear symmetric bilaterally. Other: Mastoid air cells are clear. IMPRESSION: Persistent and stable generalized ventricular enlargement in a pattern indicative of communicating hydrocephalus. No sulcal prominence. Evidence of previous burr hole placement in the right frontal region, unchanged. Currently no drainage device seen. No intracranial mass, hemorrhage, extra-axial fluid collection. Gray-white compartments appear unremarkable and stable. Electronically Signed   By: Bretta Bang III M.D.   On: 04/16/2016 12:03   Wendee Beavers, DO 04/21/2016, 7:34 AM PGY-1, [place] [facility] FPTS Intern pager: [phone], text pages welcome

## 2016-04-22 LAB — VITAMIN B1: Vitamin B1 (Thiamine): 181.8 nmol/L (ref 66.5–200.0)

## 2016-04-22 LAB — GLUCOSE, CAPILLARY
GLUCOSE-CAPILLARY: 101 mg/dL — AB (ref 65–99)
GLUCOSE-CAPILLARY: 90 mg/dL (ref 65–99)
Glucose-Capillary: 132 mg/dL — ABNORMAL HIGH (ref 65–99)

## 2016-04-22 MED ORDER — LEVETIRACETAM 250 MG PO TABS
250.0000 mg | ORAL_TABLET | Freq: Two times a day (BID) | ORAL | Status: DC
  Administered 2016-04-22: 250 mg via ORAL
  Filled 2016-04-22 (×2): qty 1

## 2016-04-22 MED ORDER — SIMVASTATIN 10 MG PO TABS: 10.0000 mg | ORAL_TABLET | Freq: Every day | ORAL | Status: DC

## 2016-04-22 NOTE — Progress Notes (Signed)
Family Medicine Teaching Service Daily Progress Note Intern Pager: [phone]  Patient name: Austin Carney Medical record number: [id number] Date of birth: [birth year] Age: 51 y.o. Gender: male  Primary Care Provider: PROVIDER NOT IN SYSTEM Consultants: None Code Status: Full (assumed)  Pt Overview and Major Events to Date:  2/10: Admitted for AMS 2/12: Mentation at baseline  Assessment and Plan: Austin Carney is a 51 y.o. male presenting with acute encephalopathy. PMH is significant for Parkinson's dementia, seizure disorder, bipolar 1 disorder, BPH, COPD, DM, HLD, HTN.  #Acute Encephalopathy: Acute, resolved. Likely secondary to hypoglycemia given CBGs 40s on admission. CBGs now 100s. No significant electrolyte abnormalities. Extensive workup performed to identify enticing factor. UDS negative. UA without infection. Blood cultures NGTD. Keppra level low. Lithium level WNL. TSH and T4 WNL. --Thiamine level pending --Discussed over phone with patient's brother verifying baseline mentation, patient at baseline  #Hypoglycemia: Unclear etiology. A1c 4.7 during hospitalization. Possibly related to poor PO intake. Patient may be on antihyperglycemic though does not have any on his medication list. Mild leukocytosis on admission now resolved. Adrenal insufficiency unlikely given normal morning cortisol level. Insulinoma possible but rare, insulin and C-peptide level not drawn. Doubt sepsis given lack of fever, though still must be considered. --D5NS decreased to 50 mL/hr --Monitor CBGs qACHS now that he is eating --Consider C-peptide, insulin levels etc to rule out insulinoma  --Patient will need daily CBG checks at ALF for 2 weeks upon discharge  #AKI: Acute, resolved. Cr 1.24, improved from 1.58. Baseline <1. Likely prerenal. --D5NS decreased to 50 mL/hr --Avoid nephrotoxic medications  #Lactic Acidosis: Acute, resolved. Elevated at 2.01 on admission. Improved to 1.03 with  IVF.  #Seizure Disorder: Chronic, stable. On depakote 125 mg TID, keppra 250 mg BID. Depakote within therapeutic range. Keppra level low at 4.0. --Depakote 125 mg TID --Keppra 250 mg BID  #Bipolar 1 Disorder: Chronic, stable. On haldol 15mg  bid and lithium 600 mg BID. Lithium level within normal ranges. --Haldol 15 mg BID --Discontinuing Haldol on discharge  #Hypertension  Hyperlipidemia: Chronic, stable. On simvastatin as outpatient. --Simvastatin 10 mg QD  FEN/GI: Regular diet, D5NS decreased to 50 mL/hr Prophylaxis: Lovenox SQ  Disposition: Pending 24 hours without sitter with anticipation discharged to ALF later today.  Subjective:  Patient states he is feeling great. Denies pain, weakness, nausea, shortness of breath. Asking if his brother in [place] is aware before he is at.  Objective: Temp:  [98.5 F (36.9 C)-99.2 F (37.3 C)] 99.2 F (37.3 C) (02/13 0509) Pulse Rate:  [88-106] 88 (02/13 0509) Resp:  [18-19] 19 (02/13 0509) BP: (132-170)/(83-98) 132/83 (02/13 0509) SpO2:  [98 %] 98 % (02/13 0509)   Physical Exam: General: well nourished, well developed, in no acute distress with non-toxic appearance HEENT: normocephalic, atraumatic, moist mucous membranes, bilateral cataracts CV: regular rate and rhythm without murmurs, rubs, or gallops Lungs: clear to auscultation bilaterally with normal work of breathing Skin: warm, dry, no rashes or lesions, cap refill < 2 seconds Extremities: warm and well perfused, normal tone Psych: alert and oriented only to person and place, euthymic mood with congruent affect  Laboratory:  Recent Labs Lab 04/16/16 1308 04/19/16 1717 04/20/16 0555  WBC 9.1 11.7* 9.3  HGB 13.0 11.3* 12.0*  HCT 39.4 34.8* 36.1*  PLT 185 167 175    Recent Labs Lab 04/16/16 1308 04/19/16 1717 04/20/16 0555  NA 142 140 143  K 4.0 4.0 3.6  CL 108 111 111  CO2 31 21*  23  BUN 15 21* 12  CREATININE 1.36* 1.58* 1.24  CALCIUM 9.6 9.1  9.2  PROT 7.8 6.0* 6.4*  BILITOT 0.7 0.4 0.8  ALKPHOS 53 49 51  ALT 10* 12* 13*  AST 14* 22 18  GLUCOSE 79 167* 110*   Urinalysis    Component Value Date/Time   COLORURINE YELLOW 04/19/2016 1816   APPEARANCEUR CLEAR 04/19/2016 1816   APPEARANCEUR Clear 10/06/2012 0159   LABSPEC 1.008 04/19/2016 1816   LABSPEC 1.010 10/06/2012 0159   PHURINE 6.0 04/19/2016 1816   GLUCOSEU 50 (A) 04/19/2016 1816   GLUCOSEU Negative 10/06/2012 0159   HGBUR NEGATIVE 04/19/2016 1816   BILIRUBINUR NEGATIVE 04/19/2016 1816   BILIRUBINUR Negative 10/06/2012 0159   KETONESUR NEGATIVE 04/19/2016 1816   PROTEINUR NEGATIVE 04/19/2016 1816   UROBILINOGEN 1.0 12/20/2012 2054   NITRITE NEGATIVE 04/19/2016 1816   LEUKOCYTESUR NEGATIVE 04/19/2016 1816   LEUKOCYTESUR Negative 10/06/2012 0159    --Urine culture: NGTD --Blood culture: NGTD x2 --UDS: Negative --TSH: 1.170 (WNL) --Free T4: 0.90 (WNL) --Lactic acid: 2.01>1.03 --Lithium level: 0.97 (WNL) --Levetiracetam level: 4.0 (L) --Valproic acid level: 57 (WNL) --EtOH: Negative --Cortisol AM: 18.8 (WNL) --A1c: 4.7 (L) --Vit B1: Pending  Imaging/Diagnostic Tests: CT HEAD WO CONTRAST (04/19/2016) FINDINGS: Brain: Ventricular dilatation is noted stable from the prior exam. No findings to suggest acute hemorrhage, acute infarction or space-occupying mass lesion are noted. Vascular: No hyperdense vessel or unexpected calcification. 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 Sinuses/Orbits: No acute finding. Other: None.  IMPRESSION: Stable ventricular enlargement.  No acute abnormality is noted.  DG Chest 2 View (04/19/2016) FINDINGS: Cardiac shadow is mildly enlarged. The overall inspiratory effort is poor. No focal infiltrate or sizable effusion is seen. No bony abnormality is noted.  IMPRESSION: No active cardiopulmonary disease.  CT Head Wo Contrast (04/16/2016) FINDINGS: Brain: Moderate  generalized ventricular prominence is stable. Sulci appear normal. There is no intracranial mass, hemorrhage, extra-axial fluid collection, or midline shift. Gray-white compartments appear normal. No acute infarct evident. Vascular: No hyperdense vessel. There is slight calcification in each carotid siphon region. Skull: Evidence of previous burr hole placement in the right frontal region, stable. Bony calvarium otherwise appears intact and unchanged. Sinuses/Orbits: Visualized paranasal sinuses are clear. Orbits appear symmetric bilaterally. Other: Mastoid air cells are clear.  IMPRESSION: Persistent and stable generalized ventricular enlargement in a pattern indicative of communicating hydrocephalus. No sulcal prominence. Evidence of previous burr hole placement in the right frontal region, unchanged. Currently no drainage device seen. No intracranial mass, hemorrhage, extra-axial fluid collection. Gray-white compartments appear unremarkable and stable.  Wendee Beavers, DO 04/22/2016, 7:52 AM PGY-1, [facility] FPTS Intern pager: [phone], text pages welcome

## 2016-04-22 NOTE — Discharge Summary (Signed)
Family Medicine Teaching [facility] Discharge Summary  Patient name: Austin Carney Medical record number: [id number] Date of birth: 08-30-1965 Age: 51 y.o. Gender: male Date of Admission: 04/19/2016  Date of Discharge: 04/22/2016 Admitting Physician: Leighton Roach McDiarmid, MD  Primary Care Provider: PROVIDER NOT IN SYSTEM Consultants: None  Indication for Hospitalization: Altered mental status with hypoglycemia  Discharge Diagnoses/Problem List:  Altered mental status Diabetes mellitus  History of hydrocephalus Drug-induced nephrogenic diabetes insipidus  Dementia Parkinsonian syndrome   Disposition: ALF  Discharge Condition: Stable, imporved  Discharge Exam:  General: well nourished, well developed, in no acute distress with non-toxic appearance HEENT: normocephalic, atraumatic, moist mucous membranes, bilateral cataracts CV: regular rate and rhythm without murmurs, rubs, or gallops Lungs: clear to auscultation bilaterally with normal work of breathing Skin: warm, dry, no rashes or lesions, cap refill < 2 seconds Extremities: warm and well perfused, normal tone Psych: alert and oriented only to person and place, euthymic mood with congruent affect  Brief Hospital Course:  Austin Carney a 51 y.o.malewho presented with acute encephalopathy.PMH is significant for Parkinson's dementia, seizure disorder, bipolar 1 disorder, BPH, COPD, DM, HLD, HTN.  Acute Encephalopathy: Unclear etiology. Work-up included checking medication levels (valproate WNL, keppra low), ruling out change of known hydrocephalus (stable on CT), and checking blood and urine cultures to rule-out infection (no growth to date). UDS was negative. Obtaining EEG due to history of seizures was considered but patient alert and without rhythmic motions beyond baseline tremor. He was given broad spectrum antibiotics (vanc and levaquin) for mild leukocytosis but did not decompensate when these were stopped and  remained afebrile. Patient seemed back to baseline (oriented to self and place) once taking regular meals and with restarting home haldol.   Hypoglycemia: Blood sugar 58 on admission. Could be secondary to poor PO intake, though CBGs stayed in low 100s despite drinking sugary beverages and after eating meals. Interestingly, patient had glucose in his urine on admission UA. Hgb A1c 4.7 despite history of diabetes mellitus. Confirmed with ALF that patient is not on any medications for diabetes. No further episodes of hypoglycemia after starting regular diet. AM cortisol was checked and was normal, suggesting against adrenal insufficiency.   Dehydration: Had acute kidney injury that improved with IVFs. Urine very dilute, which could indicate diabetes insipidus (on lithium).   Issues for Follow Up:  1. Check CBG daily for 2 weeks given hypoglycemia without identifiable cause causing altered mental status. Consider checking C-peptide and insulin level if hypoglycemia persists. CBGs and 100s and stable on discharge. 2. Patient presented with lactic acidosis which quickly resolved with fluids. Please assess fluid status. Patient with good oral intake on discharge. 3. Follow-up thiamine level and Keppra level (low on admission). 4. Would recommend discontinuing Haldol or considering alternative agent, such as seroquel, given increased dopaminergic affects which could exacerbate patient's Parkinson disease. If this change is made and tremor improves, would consider discontinuing cogentin.   Significant Procedures: None  Significant Labs and Imaging:   Recent Labs Lab 04/16/16 1308 04/19/16 1717 04/20/16 0555  WBC 9.1 11.7* 9.3  HGB 13.0 11.3* 12.0*  HCT 39.4 34.8* 36.1*  PLT 185 167 175    Recent Labs Lab 04/16/16 1308 04/19/16 1717 04/20/16 0555  NA 142 140 143  K 4.0 4.0 3.6  CL 108 111 111  CO2 31 21* 23  GLUCOSE 79 167* 110*  BUN 15 21* 12  CREATININE 1.36* 1.58* 1.24  CALCIUM  9.6 9.1 9.2  ALKPHOS  53 49 51  AST 14* 22 18  ALT 10* 12* 13*  ALBUMIN 4.4 3.3* 3.4*   Urinalysis    Component Value Date/Time   COLORURINE YELLOW 04/19/2016 1816   APPEARANCEUR CLEAR 04/19/2016 1816   APPEARANCEUR Clear 10/06/2012 0159   LABSPEC 1.008 04/19/2016 1816   LABSPEC 1.010 10/06/2012 0159   PHURINE 6.0 04/19/2016 1816   GLUCOSEU 50 (A) 04/19/2016 1816   GLUCOSEU Negative 10/06/2012 0159   HGBUR NEGATIVE 04/19/2016 1816   BILIRUBINUR NEGATIVE 04/19/2016 1816   BILIRUBINUR Negative 10/06/2012 0159   KETONESUR NEGATIVE 04/19/2016 1816   PROTEINUR NEGATIVE 04/19/2016 1816   UROBILINOGEN 1.0 12/20/2012 2054   NITRITE NEGATIVE 04/19/2016 1816   LEUKOCYTESUR NEGATIVE 04/19/2016 1816   LEUKOCYTESUR Negative 10/06/2012 0159   --Urine culture: NGTD --Blood culture: NGTD x2 --UDS: Negative --TSH: 1.170 (WNL) --Free T4: 0.90 (WNL) --Lactic acid: 2.01>1.03 --Lithium level: 0.97 (WNL) --Levetiracetam level: 4.0 (L) --Valproic acid level: 57 (WNL) --EtOH: Negative --Cortisol AM: 18.8 (WNL) --A1c: 4.7 (L) --Vit B1: 181.8 (WNL)  CT HEAD WO CONTRAST (04/19/2016) FINDINGS: Brain: Ventricular dilatation is noted stable from the prior exam. No findings to suggest acute hemorrhage, acute infarction or space-occupying mass lesion are noted. Vascular: No hyperdense vessel or unexpected calcification. Skull: Ines BloomerBurr hole is again noted in the right frontal region stable from the previous exam. Sinuses/Orbits: No acute finding. Other: None.  IMPRESSION: Stable ventricular enlargement. No acute abnormality is noted.  DG Chest 2 View (04/19/2016) FINDINGS: Cardiac shadow is mildly enlarged. The overall inspiratory effort is poor. No focal infiltrate or sizable effusion is seen. No bony abnormality is noted.  IMPRESSION: No active cardiopulmonary disease.  CT Head Wo Contrast (04/16/2016) FINDINGS: Brain: Moderate generalized ventricular prominence is stable. Sulci appear  normal. There is no intracranial mass, hemorrhage, extra-axial fluid collection, or midline shift. Gray-white compartments appear normal. No acute infarct evident. Vascular: No hyperdense vessel. There is slight calcification in each carotid siphon region. Skull: Evidence of previous burr hole placement in the right frontal region, stable. Bony calvarium otherwise appears intact and unchanged. Sinuses/Orbits: Visualized paranasal sinuses are clear. Orbits appear symmetric bilaterally. Other: Mastoid air cells are clear.  IMPRESSION: Persistent and stable generalized ventricular enlargement in a pattern indicative of communicating hydrocephalus. No sulcal prominence. Evidence of previous burr hole placement in the right frontal region, unchanged. Currently no drainage device seen. No intracranial mass, hemorrhage, extra-axial fluid collection. Gray-white compartments appear unremarkable and stable.  Results/Tests Pending at Time of Discharge:  Discharge Medications:  Allergies as of 04/22/2016      Reactions   Penicillins Other (See Comments)   Unknown- on MAR Has patient had a PCN reaction causing immediate rash, facial/tongue/throat swelling, SOB or lightheadedness with hypotension: unknown Has patient had a PCN reaction causing severe rash involving mucus membranes or skin necrosis: unknown Has patient had a PCN reaction that required hospitalization " unknown Has patient had a PCN reaction occurring within the last 10 years: unknown If all of the above answers are "NO", then may proceed with Cephalosporin use.        Medication List    TAKE these medications   albuterol 108 (90 Base) MCG/ACT inhaler Commonly known as:  PROVENTIL HFA;VENTOLIN HFA Inhale 2 puffs into the lungs every 2 (two) hours as needed for wheezing or shortness of breath (cough).   benztropine 1 MG tablet Commonly known as:  COGENTIN Take 1 mg by mouth daily.   benztropine 0.5 MG tablet Commonly known as:   COGENTIN  Take 0.5 mg by mouth at bedtime.   busPIRone 15 MG tablet Commonly known as:  BUSPAR Take 15 mg by mouth 4 (four) times daily. 9am, 12pm, 4pm, 8pm   divalproex 125 MG capsule Commonly known as:  DEPAKOTE SPRINKLE Take 125 mg by mouth 3 (three) times daily.   eucerin cream Apply 1 application topically daily.   haloperidol 5 MG tablet Commonly known as:  HALDOL Take 15 mg by mouth 2 (two) times daily.   levETIRAcetam 250 MG tablet Commonly known as:  KEPPRA Take 250 mg by mouth 2 (two) times daily.   lithium 300 MG tablet Take 600 mg by mouth 2 (two) times daily.   simvastatin 10 MG tablet Commonly known as:  ZOCOR Take 10 mg by mouth at bedtime.   tamsulosin 0.4 MG Caps capsule Commonly known as:  FLOMAX Take 1 capsule (0.4 mg total) by mouth daily.       Discharge Instructions: Please refer to Patient Instructions section of EMR for full details.  Patient was counseled important signs and symptoms that should prompt return to medical care, changes in medications, dietary instructions, activity restrictions, and follow up appointments.   Follow-Up Appointments: Follow-up Information    [facility]. Call on 04/23/2016.   Why:  Please call and schedule appointment for hospital follow-up given altered mental status. Contact information: [address]     [address] [place] [address] [phone]          Casey Burkitt, MD 04/22/2016, 2:59 PM PGY-2, [facility]

## 2016-04-22 NOTE — Clinical Social Work Placement (Signed)
   CLINICAL SOCIAL WORK PLACEMENT  NOTE  Date:  04/22/2016  Patient Details  Name: Austin Carney MRN: [id number] Date of Birth: [birth year]  Clinical Social Work is seeking post-discharge placement for this patient at the Assisted Living Facility level of care (*CSW will initial, date and re-position this form in  chart as items are completed):  No (returning to ALF)   Patient/family provided with [facility] Clinical Social Work Department's list of facilities offering this level of care within the geographic area requested by the patient (or if unable, by the patient's family).  No (returning to ALF)   Patient/family informed of their freedom to choose among providers that offer the needed level of care, that participate in Medicare, Medicaid or managed care program needed by the patient, have an available bed and are willing to accept the patient.  No (returning to ALF)   Patient/family informed of [facility]'s ownership interest in [facility] and [facility], as well as of the fact that they are under no obligation to receive care at these facilities.  PASRR submitted to EDS on       PASRR number received on       Existing PASRR number confirmed on 04/22/16     FL2 transmitted to all facilities in geographic area requested by pt/family on       FL2 transmitted to all facilities within larger geographic area on       Patient informed that his/her managed care company has contracts with or will negotiate with certain facilities, including the following:        Yes   Patient/family informed of bed offers received.  Patient chooses bed at  [facility])     Physician recommends and patient chooses bed at      Patient to be transferred to  [facility] on 04/22/16.  Patient to be transferred to facility by PTAR     Patient family notified on 04/22/16 of transfer.  Name of family member notified:        PHYSICIAN        Additional Comment:    _______________________________________________ Dominic PeaJeneya G Lorraine Cimmino, LCSW 04/22/2016, 7:08 PM

## 2016-04-22 NOTE — Clinical Social Work Note (Signed)
Pt is ready for discharge today and will go to [facility]. Pt agreeable to discharge plan. CSW redid FL-2 and communicated with Raynelle FanningJulie (admissions) for room and report. Room and report provided to RN and put in treatment team sticky note. Transportation arranged with PTAR. CSW is signing off as no further needs identified.  Corlis HoveJeneya Flecia Carney, MSW, [facility]  Clinical Social Worker  [phone]

## 2016-04-22 NOTE — Discharge Instructions (Signed)
You were admitted for altered mental status. We completed an extensive workup which did not find the offending factor. Your mentation improved during her hospital stay and you are safe for discharge.

## 2016-04-22 NOTE — Progress Notes (Signed)
Pt discharged back to [facility].  IV removed. Issues with FL2 and PTAR.  FL2 not signed.  Called MD and had FL2 signed. Gave signed copy to transport. Report called and given to staff.  Called and made brother aware pt was being discharged.

## 2016-04-22 NOTE — Clinical Social Work Note (Signed)
Clinical Social Work Assessment  Patient Details  Name: Austin Carney MRN: [id number] Date of Birth: 1965-12-19  Date of referral:  04/22/16               Reason for consult:  Discharge Planning                Permission sought to share information with:  Chartered certified accountant granted to share information::  Yes, Verbal Permission Granted  Name::        Agency::  [facility]  Relationship::     Contact Information:     Housing/Transportation Living arrangements for the past 2 months:  [place] of Information:  Patient, Facility Patient Interpreter Needed:  None Criminal Activity/Legal Involvement Pertinent to Current Situation/Hospitalization:  No - Comment as needed Significant Relationships:  None Lives with:  Facility Resident Do you feel safe going back to the place where you live?  Yes Need for family participation in patient care:  No (Coment)  Care giving concerns:  No caregiving concerns identified.    Social Worker assessment / plan:  CSW met with pt to address consult for returning to ALF. CSW introduced herself and explained role of social work. CSW explained discharging to ALF. Pt in agreement with discharge plan. Pt was confused through some of the conversation. Pt was alert and oriented to person only.    CSW will send updated FL-2 to [facility] for d/c today.   Employment status:  Disabled (Comment on whether or not currently receiving Disability) Insurance information:  Medicaid In [place] PT Recommendations:  Not assessed at this time Information / Referral to community resources:  Other (Comment Required) (ALF)  Patient/Family's Response to care:  Pt was appreciative and supportive of CSW support.   Patient/Family's Understanding of and Emotional Response to Diagnosis, Current Treatment, and Prognosis:  Pt informed that he will return to ALF.   Emotional Assessment Appearance:   Appears stated age Attitude/Demeanor/Rapport:   (Appropriate) Affect (typically observed):  Accepting, Adaptable, Pleasant Orientation:  Oriented to Self Alcohol / Substance use:  Other Psych involvement (Current and /or in the community):  No (Comment)  Discharge Needs  Concerns to be addressed:  Adjustment to Illness Readmission within the last 30 days:  No Current discharge risk:  Terminally ill Barriers to Discharge:  Continued Medical Work up   Truitt Merle, LCSW 04/22/2016, 6:58 PM

## 2016-04-22 NOTE — [place] FL2 (Signed)
[name] Birthdate: Jul 02, 1965 Sex: male Admission Date (Current Location): 04/19/2016  [place]:  [place] [id number] Facility and Address:  [facility] [place], [address], [place], [place] [address]      Provider Number: [id number]  Attending Physician Name and Address:  Austin Roachodd D McDiarmid, MD  Relative Name and Phone Number:       Current Level of Care: Hospital Recommended Level of Care: Assisted Living Facility Prior Approval Number:    Date Approved/Denied:   PASRR Number:    Discharge Plan: Domiciliary (Rest home)    Current Diagnoses: Patient Active Problem List   Diagnosis Date Noted  . Dementia due to Parkinson's disease with behavioral disturbance (HCC)   . Type 2 diabetes mellitus with complication, without long-term current use of insulin (HCC) 04/20/2016  . History of hydrocephalus, S/P Ventriculostomy 04/20/2016  . 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 Parkinsonian syndrome (HCC) 04/20/2016  . Altered mental status 04/19/2016  . Hypoglycemia   . 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 Bipolar 1 disorder (HCC)   . Hyperlipidemia   . AKI (acute kidney injury) (HCC)   . Lactic acidosis     Orientation RESPIRATION BLADDER Height & Weight     Self  Normal Continent Weight: 268 lb 8 oz (121.8 kg) Height:  5\' 7"  (170.2 cm)  BEHAVIORAL SYMPTOMS/MOOD NEUROLOGICAL BOWEL NUTRITION STATUS      Continent Diet (Carb modified; thin liquids)  AMBULATORY STATUS COMMUNICATION OF NEEDS Skin   Supervision Verbally Normal                       Personal Care Assistance Level of Assistance  Bathing, Feeding, Dressing Bathing Assistance: Limited assistance Feeding assistance: Limited assistance Dressing Assistance: Limited assistance     Functional Limitations Info  Sight, Hearing, Speech Sight Info:  Adequate Hearing Info: Adequate Speech Info: Adequate    SPECIAL CARE FACTORS FREQUENCY                       Contractures Contractures Info: Not present    Additional Factors Info  Code Status, Allergies, Psychotropic Code Status Info: Full Allergies Info: Penicillins Psychotropic Info: busPIRone (BUSPAR); haloperidol (HALDOL); divalproex (DEPAKOTE SPRINKLE); levETIRAcetam (KEPPRA); lithium carbonate          Current Medications (04/22/2016):  This is the current hospital active medication list Current Facility-Administered Medications  Medication Dose Route Frequency Provider Last Rate Last Dose  . dextrose 5 %-0.9 % sodium chloride infusion   Intravenous Continuous Casey BurkittHillary Moen Fitzgerald, MD 50 mL/hr at 04/20/16 1030    . divalproex (DEPAKOTE SPRINKLE) capsule 125 mg  125 mg Oral TID Casey BurkittHillary Moen Fitzgerald, MD   125 mg at 04/22/16 0948  . enoxaparin (LOVENOX) injection 60 mg  60 mg Subcutaneous Q24H Ardith Darkaleb M Parker, MD   60 mg at 04/22/16 0947  . haloperidol (HALDOL) tablet 15 mg  15 mg Oral BID Casey BurkittHillary Moen Fitzgerald, MD   15 mg at 04/22/16 0949  . levETIRAcetam (KEPPRA) tablet 250 mg  250 mg Oral BID Wendee Beaversavid J McMullen, DO   250 mg at 04/22/16 [id number]  . lithium carbonate capsule 600 mg  600 mg Oral BID Casey BurkittHillary Moen Fitzgerald, MD   600 mg at 04/22/16 0948  . simvastatin (ZOCOR) tablet 10 mg  10 mg Oral QHS Elmon Elseavid J  McMullen, DO      . thiamine (VITAMIN B-1) tablet 100 mg  100 mg Oral Daily Darl Householder Masters, RPH   100 mg at 04/22/16 1610     Discharge Medications: Please see discharge summary for a list of discharge medications.  Relevant Imaging Results:  Relevant Lab Results:   Additional Information SSN: [id number]  Dominic Pea, LCSW

## 2016-04-24 LAB — CULTURE, BLOOD (ROUTINE X 2)
CULTURE: NO GROWTH
Culture: NO GROWTH

## 2016-10-30 ENCOUNTER — Emergency Department (HOSPITAL_COMMUNITY)
Admission: EM | Admit: 2016-10-30 | Discharge: 2016-10-30 | Disposition: A | Discharge status: Home / Self Care | Payer: Medicaid Other | Attending: Emergency Medicine | Admitting: Emergency Medicine

## 2016-10-30 ENCOUNTER — Encounter (HOSPITAL_COMMUNITY): Payer: Self-pay | Admitting: Emergency Medicine

## 2016-10-30 DIAGNOSIS — F0391 Unspecified dementia with behavioral disturbance: Secondary | ICD-10-CM | POA: Diagnosis present

## 2016-10-30 DIAGNOSIS — G40909 Epilepsy, unspecified, not intractable, without status epilepticus: Secondary | ICD-10-CM | POA: Diagnosis not present

## 2016-10-30 DIAGNOSIS — F319 Bipolar disorder, unspecified: Secondary | ICD-10-CM | POA: Diagnosis not present

## 2016-10-30 DIAGNOSIS — J449 Chronic obstructive pulmonary disease, unspecified: Secondary | ICD-10-CM | POA: Insufficient documentation

## 2016-10-30 DIAGNOSIS — Z79899 Other long term (current) drug therapy: Secondary | ICD-10-CM | POA: Diagnosis not present

## 2016-10-30 DIAGNOSIS — N390 Urinary tract infection, site not specified: Secondary | ICD-10-CM | POA: Insufficient documentation

## 2016-10-30 DIAGNOSIS — G20 Parkinson's disease: Secondary | ICD-10-CM | POA: Insufficient documentation

## 2016-10-30 DIAGNOSIS — E119 Type 2 diabetes mellitus without complications: Secondary | ICD-10-CM | POA: Diagnosis not present

## 2016-10-30 DIAGNOSIS — Z87891 Personal history of nicotine dependence: Secondary | ICD-10-CM | POA: Diagnosis not present

## 2016-10-30 LAB — CBC WITH DIFFERENTIAL/PLATELET
Basophils Absolute: 0 10*3/uL (ref 0.0–0.1)
Basophils Relative: 0 %
Eosinophils Absolute: 0.2 10*3/uL (ref 0.0–0.7)
Eosinophils Relative: 3 %
HEMATOCRIT: 38.7 % — AB (ref 39.0–52.0)
Hemoglobin: 13.3 g/dL (ref 13.0–17.0)
LYMPHS ABS: 2.2 10*3/uL (ref 0.7–4.0)
LYMPHS PCT: 26 %
MCH: 29.6 pg (ref 26.0–34.0)
MCHC: 34.4 g/dL (ref 30.0–36.0)
MCV: 86.2 fL (ref 78.0–100.0)
MONO ABS: 0.6 10*3/uL (ref 0.1–1.0)
MONOS PCT: 7 %
NEUTROS ABS: 5.4 10*3/uL (ref 1.7–7.7)
Neutrophils Relative %: 64 %
Platelets: 154 10*3/uL (ref 150–400)
RBC: 4.49 MIL/uL (ref 4.22–5.81)
RDW: 12.9 % (ref 11.5–15.5)
WBC: 8.4 10*3/uL (ref 4.0–10.5)

## 2016-10-30 LAB — URINALYSIS, ROUTINE W REFLEX MICROSCOPIC
BILIRUBIN URINE: NEGATIVE
Bacteria, UA: NONE SEEN
Glucose, UA: NEGATIVE mg/dL
HGB URINE DIPSTICK: NEGATIVE
Ketones, ur: NEGATIVE mg/dL
NITRITE: NEGATIVE
PH: 7 (ref 5.0–8.0)
Protein, ur: NEGATIVE mg/dL
SPECIFIC GRAVITY, URINE: 1.006 (ref 1.005–1.030)

## 2016-10-30 LAB — RAPID URINE DRUG SCREEN, HOSP PERFORMED
Amphetamines: NOT DETECTED
BARBITURATES: NOT DETECTED
Benzodiazepines: POSITIVE — AB
Cocaine: NOT DETECTED
Opiates: NOT DETECTED
TETRAHYDROCANNABINOL: NOT DETECTED

## 2016-10-30 LAB — CBG MONITORING, ED: Glucose-Capillary: 74 mg/dL (ref 65–99)

## 2016-10-30 MED ORDER — SULFAMETHOXAZOLE-TRIMETHOPRIM 800-160 MG PO TABS
1.0000 | ORAL_TABLET | Freq: Two times a day (BID) | ORAL | 0 refills | Status: AC
Start: 2016-10-30 — End: 2016-11-06

## 2016-10-30 NOTE — ED Triage Notes (Signed)
Pt from [facility] for S/I denies plain denies AV Hallucinatons. Pt brought to ED by [employer]
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
## 2016-10-30 NOTE — ED Notes (Addendum)
This writer unable to collect to complete all labs order. CBC (Lav) sent down to lab

## 2016-12-25 ENCOUNTER — Emergency Department (HOSPITAL_COMMUNITY)
Admission: EM | Admit: 2016-12-25 | Discharge: 2016-12-25 | Disposition: A | Discharge status: Home / Self Care | Payer: Medicaid Other | Attending: Emergency Medicine | Admitting: Emergency Medicine

## 2016-12-25 ENCOUNTER — Encounter (HOSPITAL_COMMUNITY): Payer: Self-pay | Admitting: Nurse Practitioner

## 2016-12-25 DIAGNOSIS — R4689 Other symptoms and signs involving appearance and behavior: Secondary | ICD-10-CM | POA: Insufficient documentation

## 2016-12-25 DIAGNOSIS — G20 Parkinson's disease: Secondary | ICD-10-CM | POA: Diagnosis not present

## 2016-12-25 DIAGNOSIS — J449 Chronic obstructive pulmonary disease, unspecified: Secondary | ICD-10-CM | POA: Diagnosis not present

## 2016-12-25 DIAGNOSIS — Z79899 Other long term (current) drug therapy: Secondary | ICD-10-CM | POA: Diagnosis not present

## 2016-12-25 DIAGNOSIS — Z87891 Personal history of nicotine dependence: Secondary | ICD-10-CM | POA: Diagnosis not present

## 2016-12-25 DIAGNOSIS — F0281 Dementia in other diseases classified elsewhere with behavioral disturbance: Secondary | ICD-10-CM | POA: Insufficient documentation

## 2016-12-25 DIAGNOSIS — E119 Type 2 diabetes mellitus without complications: Secondary | ICD-10-CM | POA: Diagnosis not present

## 2016-12-25 DIAGNOSIS — F918 Other conduct disorders: Secondary | ICD-10-CM | POA: Diagnosis present

## 2016-12-25 LAB — URINALYSIS, ROUTINE W REFLEX MICROSCOPIC
Bilirubin Urine: NEGATIVE
GLUCOSE, UA: NEGATIVE mg/dL
HGB URINE DIPSTICK: NEGATIVE
Ketones, ur: NEGATIVE mg/dL
Leukocytes, UA: NEGATIVE
Nitrite: NEGATIVE
PH: 7 (ref 5.0–8.0)
PROTEIN: NEGATIVE mg/dL
Specific Gravity, Urine: 1.005 (ref 1.005–1.030)

## 2016-12-25 NOTE — ED Notes (Signed)
[facility] called for transport.  
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
## 2016-12-25 NOTE — ED Triage Notes (Signed)
[facility]. Staff stated patient was more aggressive than usual and wanted him checked for a UTI.

## 2016-12-25 NOTE — Discharge Instructions (Signed)
Urine was not infected in the ER today. Patient is agreeable, appropriate. Able to follow two step commands without difficulty.   He denies homicidal and suicidal ideation.   Please return if he has any new or worsening symptoms.
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
## 2017-01-12 ENCOUNTER — Emergency Department (HOSPITAL_COMMUNITY)
Admission: EM | Admit: 2017-01-12 | Discharge: 2017-01-16 | Disposition: A | Discharge status: Home / Self Care | Payer: Medicaid Other | Attending: Emergency Medicine | Admitting: Emergency Medicine

## 2017-01-12 ENCOUNTER — Encounter (HOSPITAL_COMMUNITY): Payer: Self-pay | Admitting: Emergency Medicine

## 2017-01-12 ENCOUNTER — Other Ambulatory Visit: Payer: Self-pay

## 2017-01-12 DIAGNOSIS — R4689 Other symptoms and signs involving appearance and behavior: Secondary | ICD-10-CM

## 2017-01-12 DIAGNOSIS — Z008 Encounter for other general examination: Secondary | ICD-10-CM

## 2017-01-12 DIAGNOSIS — Z046 Encounter for general psychiatric examination, requested by authority: Secondary | ICD-10-CM | POA: Insufficient documentation

## 2017-01-12 DIAGNOSIS — Z87891 Personal history of nicotine dependence: Secondary | ICD-10-CM | POA: Diagnosis not present

## 2017-01-12 DIAGNOSIS — F0281 Dementia in other diseases classified elsewhere with behavioral disturbance: Secondary | ICD-10-CM | POA: Insufficient documentation

## 2017-01-12 DIAGNOSIS — E119 Type 2 diabetes mellitus without complications: Secondary | ICD-10-CM | POA: Diagnosis not present

## 2017-01-12 DIAGNOSIS — Z818 Family history of other mental and behavioral disorders: Secondary | ICD-10-CM | POA: Diagnosis not present

## 2017-01-12 DIAGNOSIS — F02818 Dementia in other diseases classified elsewhere, unspecified severity, with other behavioral disturbance: Secondary | ICD-10-CM

## 2017-01-12 DIAGNOSIS — Z79899 Other long term (current) drug therapy: Secondary | ICD-10-CM | POA: Insufficient documentation

## 2017-01-12 DIAGNOSIS — R4182 Altered mental status, unspecified: Secondary | ICD-10-CM | POA: Diagnosis present

## 2017-01-12 DIAGNOSIS — J449 Chronic obstructive pulmonary disease, unspecified: Secondary | ICD-10-CM | POA: Diagnosis not present

## 2017-01-12 DIAGNOSIS — G20 Parkinson's disease: Secondary | ICD-10-CM | POA: Insufficient documentation

## 2017-01-12 LAB — CBC WITH DIFFERENTIAL/PLATELET
BASOS ABS: 0 10*3/uL (ref 0.0–0.1)
BASOS PCT: 0 %
Eosinophils Absolute: 0.2 10*3/uL (ref 0.0–0.7)
Eosinophils Relative: 3 %
HEMATOCRIT: 39.3 % (ref 39.0–52.0)
HEMOGLOBIN: 13.1 g/dL (ref 13.0–17.0)
LYMPHS PCT: 24 %
Lymphs Abs: 1.9 10*3/uL (ref 0.7–4.0)
MCH: 29.4 pg (ref 26.0–34.0)
MCHC: 33.3 g/dL (ref 30.0–36.0)
MCV: 88.1 fL (ref 78.0–100.0)
MONO ABS: 0.4 10*3/uL (ref 0.1–1.0)
MONOS PCT: 5 %
NEUTROS ABS: 5.3 10*3/uL (ref 1.7–7.7)
NEUTROS PCT: 68 %
Platelets: 173 10*3/uL (ref 150–400)
RBC: 4.46 MIL/uL (ref 4.22–5.81)
RDW: 13 % (ref 11.5–15.5)
WBC: 7.8 10*3/uL (ref 4.0–10.5)

## 2017-01-12 LAB — COMPREHENSIVE METABOLIC PANEL
ALBUMIN: 4.2 g/dL (ref 3.5–5.0)
ALK PHOS: 66 U/L (ref 38–126)
ALT: 12 U/L — ABNORMAL LOW (ref 17–63)
ANION GAP: 8 (ref 5–15)
AST: 21 U/L (ref 15–41)
BILIRUBIN TOTAL: 0.8 mg/dL (ref 0.3–1.2)
BUN: 10 mg/dL (ref 6–20)
CHLORIDE: 110 mmol/L (ref 101–111)
CO2: 27 mmol/L (ref 22–32)
Calcium: 9.7 mg/dL (ref 8.9–10.3)
Creatinine, Ser: 1.5 mg/dL — ABNORMAL HIGH (ref 0.61–1.24)
GFR calc Af Amer: 60 mL/min — ABNORMAL LOW (ref >60)
GFR calc non Af Amer: 51 mL/min — ABNORMAL LOW (ref >60)
GLUCOSE: 91 mg/dL (ref 65–99)
POTASSIUM: 4.1 mmol/L (ref 3.5–5.1)
Sodium: 145 mmol/L (ref 135–145)
Total Protein: 7.9 g/dL (ref 6.5–8.1)

## 2017-01-12 LAB — URINALYSIS, ROUTINE W REFLEX MICROSCOPIC
Bilirubin Urine: NEGATIVE
GLUCOSE, UA: NEGATIVE mg/dL
Hgb urine dipstick: NEGATIVE
KETONES UR: NEGATIVE mg/dL
LEUKOCYTES UA: NEGATIVE
NITRITE: NEGATIVE
PH: 7 (ref 5.0–8.0)
PROTEIN: NEGATIVE mg/dL
Specific Gravity, Urine: 1.003 — ABNORMAL LOW (ref 1.005–1.030)

## 2017-01-12 LAB — RAPID URINE DRUG SCREEN, HOSP PERFORMED
Amphetamines: NOT DETECTED
BENZODIAZEPINES: NOT DETECTED
Barbiturates: NOT DETECTED
COCAINE: NOT DETECTED
OPIATES: NOT DETECTED
Tetrahydrocannabinol: NOT DETECTED

## 2017-01-12 LAB — SALICYLATE LEVEL: Salicylate Lvl: <7 mg/dL (ref 2.8–30.0)

## 2017-01-12 LAB — ETHANOL: Alcohol, Ethyl (B): <10 mg/dL (ref <10)

## 2017-01-12 LAB — ACETAMINOPHEN LEVEL

## 2017-01-12 MED ORDER — AMLODIPINE BESYLATE 5 MG PO TABS
5.0000 mg | ORAL_TABLET | Freq: Every day | ORAL | Status: DC
  Administered 2017-01-12 – 2017-01-16 (×5): 5 mg via ORAL
  Filled 2017-01-12 (×5): qty 1

## 2017-01-12 MED ORDER — LEVETIRACETAM 250 MG PO TABS
250.0000 mg | ORAL_TABLET | Freq: Two times a day (BID) | ORAL | Status: DC
  Administered 2017-01-12 – 2017-01-16 (×8): 250 mg via ORAL
  Filled 2017-01-12 (×8): qty 1

## 2017-01-12 MED ORDER — HYDROCERIN EX CREA
1.0000 "application " | TOPICAL_CREAM | Freq: Every day | CUTANEOUS | Status: DC
  Administered 2017-01-14 – 2017-01-16 (×3): 1 via TOPICAL
  Filled 2017-01-12 (×2): qty 113

## 2017-01-12 MED ORDER — LITHIUM CARBONATE ER 300 MG PO TBCR
600.0000 mg | EXTENDED_RELEASE_TABLET | Freq: Two times a day (BID) | ORAL | Status: DC
  Administered 2017-01-12: 300 mg via ORAL
  Administered 2017-01-13 – 2017-01-16 (×7): 600 mg via ORAL
  Filled 2017-01-12 (×8): qty 2

## 2017-01-12 MED ORDER — BUSPIRONE HCL 10 MG PO TABS
15.0000 mg | ORAL_TABLET | Freq: Three times a day (TID) | ORAL | Status: DC
  Administered 2017-01-12 – 2017-01-16 (×11): 15 mg via ORAL
  Filled 2017-01-12 (×11): qty 2

## 2017-01-12 MED ORDER — BENZTROPINE MESYLATE 0.5 MG PO TABS
0.5000 mg | ORAL_TABLET | Freq: Every day | ORAL | Status: DC
  Administered 2017-01-12 – 2017-01-15 (×4): 0.5 mg via ORAL
  Filled 2017-01-12 (×4): qty 1

## 2017-01-12 MED ORDER — ONDANSETRON HCL 4 MG PO TABS: 4.0000 mg | ORAL_TABLET | Freq: Three times a day (TID) | ORAL | Status: DC | PRN

## 2017-01-12 MED ORDER — ACETAMINOPHEN 325 MG PO TABS: 650.0000 mg | ORAL_TABLET | ORAL | Status: DC | PRN

## 2017-01-12 MED ORDER — DIVALPROEX SODIUM 500 MG PO DR TAB
500.0000 mg | DELAYED_RELEASE_TABLET | Freq: Two times a day (BID) | ORAL | Status: DC
  Administered 2017-01-12 – 2017-01-16 (×9): 500 mg via ORAL
  Filled 2017-01-12 (×9): qty 1

## 2017-01-12 MED ORDER — HALOPERIDOL 5 MG PO TABS
15.0000 mg | ORAL_TABLET | Freq: Two times a day (BID) | ORAL | Status: DC
  Administered 2017-01-12 – 2017-01-16 (×9): 15 mg via ORAL
  Filled 2017-01-12 (×9): qty 3

## 2017-01-12 MED ORDER — BENZTROPINE MESYLATE 1 MG PO TABS
1.0000 mg | ORAL_TABLET | Freq: Every day | ORAL | Status: DC
  Administered 2017-01-12 – 2017-01-16 (×5): 1 mg via ORAL
  Filled 2017-01-12 (×5): qty 1

## 2017-01-12 MED ORDER — ALUM & MAG HYDROXIDE-SIMETH 200-200-20 MG/5ML PO SUSP: 30.0000 mL | Freq: Four times a day (QID) | ORAL | Status: DC | PRN

## 2017-01-12 MED ORDER — TAMSULOSIN HCL 0.4 MG PO CAPS
0.4000 mg | ORAL_CAPSULE | Freq: Every day | ORAL | Status: DC
  Administered 2017-01-13 – 2017-01-16 (×4): 0.4 mg via ORAL
  Filled 2017-01-12 (×5): qty 1

## 2017-01-12 NOTE — ED Triage Notes (Signed)
[facility] , [facility], per staff aggressive behavior , early dementia , per IVC papers, staff have noticed that meds are not working.

## 2017-01-12 NOTE — ED Notes (Signed)
Pt is alert is and oriented to self. Pt was confused regarding location/state, unsure of president, and month.
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
## 2017-01-12 NOTE — ED Notes (Signed)
Called Pt Brother Ramon Dredgedward and made him aware [place] was [place] [facility]. Pt request to speak with brother and complied with request. Pt was becoming restless, required redirecting back into room.

## 2017-01-12 NOTE — BH Assessment (Signed)
[facility] Assessment Progress Note  Case was staffed with Shaune PollackLord DNP who recommended a inpatient admission (Geropsychiatric) as appropriate bed placement is investigated.
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
## 2017-01-12 NOTE — ED Notes (Signed)
Pt has  confusion, unable to recall conversation that occurred 5 minutes ago.

## 2017-01-13 DIAGNOSIS — Z818 Family history of other mental and behavioral disorders: Secondary | ICD-10-CM

## 2017-01-13 DIAGNOSIS — Z87891 Personal history of nicotine dependence: Secondary | ICD-10-CM | POA: Diagnosis not present

## 2017-01-13 DIAGNOSIS — R413 Other amnesia: Secondary | ICD-10-CM | POA: Diagnosis not present

## 2017-01-13 DIAGNOSIS — G20 Parkinson's disease: Secondary | ICD-10-CM

## 2017-01-13 DIAGNOSIS — F0281 Dementia in other diseases classified elsewhere with behavioral disturbance: Secondary | ICD-10-CM

## 2017-01-13 DIAGNOSIS — R4587 Impulsiveness: Secondary | ICD-10-CM | POA: Diagnosis not present

## 2017-01-13 LAB — VALPROIC ACID LEVEL: Valproic Acid Lvl: 91 ug/mL (ref 50.0–100.0)

## 2017-01-13 LAB — LITHIUM LEVEL: LITHIUM LVL: 0.75 mmol/L (ref 0.60–1.20)

## 2017-01-13 NOTE — Progress Notes (Addendum)
CSW attempted to contact [facility] to determine if patient is able to return. CSW was informed the admissions director had left for the night therefore an answer could not be provided. CSW was directed to call in the AM to speak with admissions. CSW updated assistant director and will update AM CSW.   Stacy ChiropodistGardnerErin Haze [place], Kingman Community HospitalCSWA Emergency Room Clinical Social Worker [phone]

## 2017-01-13 NOTE — BHH Counselor (Signed)
01/13/2017: Per Dr. Sharma CovertNorman and Elta GuadeloupeLaurie Parks, NP, patient no longer meets criteria for inpatient treatment [facility](Ger[facility]). Pending LCSW involvement to assist with patient's return back to facility.

## 2017-01-13 NOTE — ED Notes (Signed)
Pt came out of room and began ambulating in hall.  Attempted redirection back to room with staff.  Pt did turn around but would not go into room.  Security notified.  Pt was agitated.  Not following direction and increasing in agitation. Pt had to be restrained in room for safety.  Dr Tegeler notified.  Will reassess.

## 2017-01-13 NOTE — ED Notes (Signed)
Unable to draw lithium level.  Lab called to try.

## 2017-01-13 NOTE — ED Notes (Signed)
Lab unable to draw lithium level.  ER phlebotomy notified.

## 2017-01-13 NOTE — ED Notes (Signed)
Pt is calm at this time.  Trial removal of restraints to eat.  Pt verbalized understanding.

## 2017-01-13 NOTE — BHH Suicide Risk Assessment (Signed)
Suicide Risk Assessment  Discharge Assessment   [facility]   Principal Problem: Dementia due to Parkinson's disease with behavioral disturbance [facility](HCC) Discharge Diagnoses:  Patient Active Problem List   Diagnosis Date Noted  . Dementia due to Parkinson's disease with behavioral disturbance (HCC) [G20, F02.81]   . Type 2 diabetes mellitus with complication, without long-term current use of insulin (HCC) [E11.8] 04/20/2016  . History of hydrocephalus, S/P Ventriculostomy [Z86.69] 04/20/2016  . 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 Parkinsonian syndrome (HCC) [G20] 04/20/2016  . Altered mental status [R41.82] 04/19/2016  . Hypoglycemia [E16.2]   . Epilepsy, grand mal (HCC) [G40.309]   . Bipolar 1 disorder (HCC) [F31.9]   . Hyperlipidemia [E78.5]   . AKI (acute kidney injury) (HCC) [N17.9]   . Lactic acidosis [E87.2]     Total Time spent with patient: 45 minutes  Musculoskeletal: Strength & Muscle Tone: within normal limits Gait & Station: normal Patient leans: N/A  Psychiatric Specialty Exam: Physical Exam  Constitutional: He appears well-developed and well-nourished.  Respiratory: Effort normal.  Musculoskeletal: Normal range of motion.  Neurological: He is alert.  Psychiatric: He has a normal mood and affect. Thought content normal. His speech is delayed. He is slowed. He expresses impulsivity. He exhibits abnormal recent memory and abnormal remote memory.   Review of Systems  Psychiatric/Behavioral: Positive for memory loss. Negative for depression, hallucinations, substance abuse and suicidal ideas. The patient is not nervous/anxious and does not have insomnia.   All other systems reviewed and are negative.  Blood pressure 115/70, pulse 73, temperature 97.7 F (36.5 C), temperature source Oral, resp. rate 18, weight 129.3 kg (285 lb), SpO2 100 %.Body mass index is 39.75 kg/m. General Appearance: Casual Eye  Contact:  Good Speech:  Slow and stutter Volume:  Decreased Mood:  Depressed Affect:  Congruent and Depressed Thought Process:  Disorganized Orientation:  Other:  person Thought Content:  Illogical Suicidal Thoughts:  No Homicidal Thoughts:  No Memory:  Immediate;   Fair Recent;   Fair Remote;   Poor Judgement:  Impaired Insight:  Lacking Psychomotor Activity:  Decreased Concentration:  Concentration: Fair and Attention Span: Fair Recall:  Poor Fund of Knowledge:  Fair Language:  Good Akathisia:  No Handed:  Right AIMS (if indicated):    Assets:  Financial Resources/Insurance Housing Resilience Social Support ADL's:  Intact Cognition:  WNL   Mental Status Per Nursing Assessment::   On Admission:   dementia  Demographic Factors:  Male and Low socioeconomic status  Loss Factors: Financial problems/change in socioeconomic status  Historical Factors: Impulsivity  Risk Reduction Factors:   Living with another person, especially a relative and Positive social support  Continued Clinical Symptoms:  Bipolar Disorder:   Depressive phase Depression:   Impulsivity Epilepsy  Cognitive Features That Contribute To Risk:  Loss of executive function  dementia  Suicide Risk:  Minimal: No identifiable suicidal ideation.  Patients presenting with no risk factors but with morbid ruminations; may be classified as minimal risk based on the severity of the depressive symptoms    Plan Of Care/Follow-up recommendations:  Activity:  as tolerated Diet:  Heart Healthy  Laveda AbbeLaurie Britton Maleko Greulich, NP 01/13/2017, 2:23 PM

## 2017-01-13 NOTE — ED Notes (Signed)
Pt becoming aggressive and attempting to get out of bed. Soft restraints reapplied. Cap-refill and pulses intact.

## 2017-01-13 NOTE — Consult Note (Signed)
Austin Carney   Reason for Carney:  Aggressive behavior Referring Physician:  EDP Patient Identification: Austin Carney MRN:  [id number] Principal Diagnosis: Dementia due to Parkinson's disease with behavioral disturbance [facility]) Diagnosis:   Patient Active Problem List   Diagnosis Date Noted  . Dementia due to Parkinson's disease with behavioral disturbance ([place]) [G20, F02.81]   . Type 2 diabetes mellitus with complication, without long-term current use of insulin ([place]) [E11.8] 04/20/2016  . History of hydrocephalus, S/P Ventriculostomy [Z86.69] 04/20/2016  . Drug-induced nephrogenic diabetes insipidus ([place]) [N25.1] 04/20/2016  . Parkinsonian syndrome ([place]) [G20] 04/20/2016  . Altered mental status [R41.82] 04/19/2016  . Hypoglycemia [E16.2]   . Epilepsy, grand mal ([place]) [D74.128]   . Bipolar 1 disorder ([place]) [F31.9]   . Hyperlipidemia [E78.5]   . AKI (acute kidney injury) ([place]) [N17.9]   . Lactic acidosis [E87.2]     Total Time spent with patient: 45 minutes  Subjective:   Austin Carney is a 51 y.o. male patient admitted with aggressive behavior at the facility where he lives.  HPI: Pt was seen and chart reviewed with treatment team and Dr Mariea Clonts. Pt has Parkinson Dementia and Bipolar disorder. Pt resides at [facility] in [place] and became aggressive with the staff there. Pt is oriented to self only.  Pt denies suicidal/homicidal ideation, denies auditory/visual hallucinations and does not appear to be responding to internal stimuli. Pt has been calm and cooperative in the emergency room. Pt is medication compliant, sleeping and eating well. Pt stated he did not "want to bring harm to any people." Pt denies alcohol and drug use, BAL and UDS are negative. Pt is psychiatrically clear. Social work to contact facility to inquire regarding Pt's return.   Past Psychiatric History: As above  Risk to Self: None Risk to Others: None Prior Inpatient  Therapy: Prior Inpatient Therapy: Yes Prior Therapy Dates: 2014 Prior Therapy Facilty/Provider(s): [facility] Reason for Treatment: Altered mental state Prior Outpatient Therapy: Prior Outpatient Therapy: Yes Prior Therapy Dates: Ongoing Prior Therapy Facilty/Provider(s): [facility] Reason for Treatment: Med mang Does patient have an ACCT team?: No Does patient have Intensive In-House Services?  : No Does patient have [facility] services? : No Does patient have P4CC services?: No  Past Medical History:  Past Medical History:  Diagnosis Date  . Bipolar 1 disorder ([place])   . BPH (benign prostatic hyperplasia)   . COPD (chronic obstructive pulmonary disease) ([place])   . Dementia   . DM (diabetes mellitus) ([place])   . Epilepsy ([place]) 1968  . Epilepsy, grand mal ([place])   . Hyperlipidemia   . Parkinson disease ([place])   . Seasonal allergic rhinitis   . Seizures ([place])     Past Surgical History:  Procedure Laterality Date  . BRAIN SURGERY    . VENTRICULOSTOMY     Family History:  Family History  Problem Relation Age of Onset  . Bipolar disorder Mother    Family Psychiatric  History: Unknown Social History:  Social History   Substance and Sexual Activity  Alcohol Use No     Social History   Substance and Sexual Activity  Drug Use No    Social History   Socioeconomic History  . Marital status: Single    Spouse name: None  . Number of children: None  . Years of education: None  . Highest education level: None  Social Needs  . Financial resource strain: None  . Food insecurity - worry: None  . Food insecurity -  inability: None  . Transportation needs - medical: None  . Transportation needs - non-medical: None  Occupational History  . None  Tobacco Use  . Smoking status: Former Research scientist (life sciences)  . Smokeless tobacco: Never Used  Substance and Sexual Activity  . Alcohol use: No  . Drug use: No  . Sexual activity: No  Other Topics Concern  . None  Social History Narrative   [name] comes to from a family of eight: five sisters and two brothers,   Additional Social History: N/A    Allergies:   Allergies  Allergen Reactions  . Penicillins Other (See Comments)    Unknown- on MAR  Has patient had a PCN reaction causing immediate rash, facial/tongue/throat swelling, SOB or lightheadedness with hypotension: unknown Has patient had a PCN reaction causing severe rash involving mucus membranes or skin necrosis: unknown Has patient had a PCN reaction that required hospitalization " unknown Has patient had a PCN reaction occurring within the last 10 years: unknown If all of the above answers are "NO", then may proceed with Cephalosporin use.      Labs:  Results for orders placed or performed during the hospital encounter of 01/12/17 (from the past 48 hour(s))  CBC w/diff     Status: None   Collection Time: 01/12/17  2:03 PM  Result Value Ref Range   WBC 7.8 4.0 - 10.5 K/uL   RBC 4.46 4.22 - 5.81 MIL/uL   Hemoglobin 13.1 13.0 - 17.0 g/dL   HCT 39.3 39.0 - 52.0 %   MCV 88.1 78.0 - 100.0 fL   MCH 29.4 26.0 - 34.0 pg   MCHC 33.3 30.0 - 36.0 g/dL   RDW 13.0 11.5 - 15.5 %   Platelets 173 150 - 400 K/uL   Neutrophils Relative % 68 %   Neutro Abs 5.3 1.7 - 7.7 K/uL   Lymphocytes Relative 24 %   Lymphs Abs 1.9 0.7 - 4.0 K/uL   Monocytes Relative 5 %   Monocytes Absolute 0.4 0.1 - 1.0 K/uL   Eosinophils Relative 3 %   Eosinophils Absolute 0.2 0.0 - 0.7 K/uL   Basophils Relative 0 %   Basophils Absolute 0.0 0.0 - 0.1 K/uL  Comprehensive metabolic panel     Status: Abnormal   Collection Time: 01/12/17  2:03 PM  Result Value Ref Range   Sodium 145 135 - 145 mmol/L   Potassium 4.1 3.5 - 5.1 mmol/L   Chloride 110 101 - 111 mmol/L   CO2 27 22 - 32 mmol/L   Glucose, Bld 91 65 - 99 mg/dL   BUN 10 6 - 20 mg/dL   Creatinine, Ser 1.50 (H) 0.61 - 1.24 mg/dL   Calcium 9.7 8.9 - 10.3 mg/dL   Total Protein 7.9 6.5 - 8.1 g/dL   Albumin 4.2 3.5 - 5.0 g/dL    AST 21 15 - 41 U/L   ALT 12 (L) 17 - 63 U/L   Alkaline Phosphatase 66 38 - 126 U/L   Total Bilirubin 0.8 0.3 - 1.2 mg/dL   GFR calc non Af Amer 51 (L) >60 mL/min   GFR calc Af Amer 60 (L) >60 mL/min    Comment: (NOTE) The eGFR has been calculated using the CKD EPI equation. This calculation has not been validated in all clinical situations. eGFR's persistently <60 mL/min signify possible Chronic Kidney Disease.    Anion gap 8 5 - 15  Ethanol     Status: None   Collection Time:  01/12/17  2:03 PM  Result Value Ref Range   Alcohol, Ethyl (B) <10 <10 mg/dL    Comment:        LOWEST DETECTABLE LIMIT FOR SERUM ALCOHOL IS 10 mg/dL FOR MEDICAL PURPOSES ONLY   Salicylate level     Status: None   Collection Time: 01/12/17  2:03 PM  Result Value Ref Range   Salicylate Lvl <6.2 2.8 - 30.0 mg/dL  Acetaminophen level     Status: Abnormal   Collection Time: 01/12/17  2:03 PM  Result Value Ref Range   Acetaminophen (Tylenol), Serum <10 (L) 10 - 30 ug/mL    Comment:        THERAPEUTIC CONCENTRATIONS VARY SIGNIFICANTLY. A RANGE OF 10-30 ug/mL MAY BE AN EFFECTIVE CONCENTRATION FOR MANY PATIENTS. HOWEVER, SOME ARE BEST TREATED AT CONCENTRATIONS OUTSIDE THIS RANGE. ACETAMINOPHEN CONCENTRATIONS >150 ug/mL AT 4 HOURS AFTER INGESTION AND >50 ug/mL AT 12 HOURS AFTER INGESTION ARE OFTEN ASSOCIATED WITH TOXIC REACTIONS.   Urinalysis, Routine w reflex microscopic     Status: Abnormal   Collection Time: 01/12/17  2:05 PM  Result Value Ref Range   Color, Urine STRAW (A) YELLOW   APPearance CLEAR CLEAR   Specific Gravity, Urine 1.003 (L) 1.005 - 1.030   pH 7.0 5.0 - 8.0   Glucose, UA NEGATIVE NEGATIVE mg/dL   Hgb urine dipstick NEGATIVE NEGATIVE   Bilirubin Urine NEGATIVE NEGATIVE   Ketones, ur NEGATIVE NEGATIVE mg/dL   Protein, ur NEGATIVE NEGATIVE mg/dL   Nitrite NEGATIVE NEGATIVE   Leukocytes, UA NEGATIVE NEGATIVE  Urine rapid drug screen (hosp performed)     Status: None   Collection  Time: 01/12/17  2:05 PM  Result Value Ref Range   Opiates NONE DETECTED NONE DETECTED   Cocaine NONE DETECTED NONE DETECTED   Benzodiazepines NONE DETECTED NONE DETECTED   Amphetamines NONE DETECTED NONE DETECTED   Tetrahydrocannabinol NONE DETECTED NONE DETECTED   Barbiturates NONE DETECTED NONE DETECTED    Comment:        DRUG SCREEN FOR MEDICAL PURPOSES ONLY.  IF CONFIRMATION IS NEEDED FOR ANY PURPOSE, NOTIFY LAB WITHIN 5 DAYS.        LOWEST DETECTABLE LIMITS FOR URINE DRUG SCREEN Drug Class       Cutoff (ng/mL) Amphetamine      1000 Barbiturate      200 Benzodiazepine   952 Tricyclics       841 Opiates          300 Cocaine          300 THC              50   Lithium level     Status: None   Collection Time: 01/13/17  9:04 AM  Result Value Ref Range   Lithium Lvl 0.75 0.60 - 1.20 mmol/L  Valproic acid level     Status: None   Collection Time: 01/13/17 11:26 AM  Result Value Ref Range   Valproic Acid Lvl 91 50.0 - 100.0 ug/mL    Current Facility-Administered Medications  Medication Dose Route Frequency Provider Last Rate Last Dose  . acetaminophen (TYLENOL) tablet 650 mg  650 mg Oral Q4H PRN Street, [place], [place]      . alum & mag hydroxide-simeth (MAALOX/MYLANTA) 200-200-20 MG/5ML suspension 30 mL  30 mL Oral Q6H PRN Street, [place], [place]      . amLODipine (NORVASC) tablet 5 mg  5 mg Oral Daily [address], [place], [place]   5 mg at 01/13/17 1108  .  benztropine (COGENTIN) tablet 0.5 mg  0.5 mg Oral QHS Street, [place], [place]   0.5 mg at 01/12/17 2124  . benztropine (COGENTIN) tablet 1 mg  1 mg Oral Daily [address], [place], [place]   1 mg at 01/13/17 1111  . busPIRone (BUSPAR) tablet 15 mg  15 mg Oral TID [address], [place], [place]   15 mg at 01/13/17 0806  . divalproex (DEPAKOTE) DR tablet 500 mg  500 mg Oral BID [address], [place], [place]   500 mg at 01/13/17 1109  . haloperidol (HALDOL) tablet 15 mg  15 mg Oral BID [address], [place], [place]   15 mg at 01/13/17 1105  . hydrocerin  (EUCERIN) cream 1 application  1 application Topical Daily [address] [place], [place]      . levETIRAcetam (KEPPRA) tablet 250 mg  250 mg Oral BID Street, [place], [place]   250 mg at 01/13/17 1109  . lithium carbonate (LITHOBID) CR tablet 600 mg  600 mg Oral BID [address], [place], [place]   600 mg at 01/13/17 1111  . ondansetron (ZOFRAN) tablet 4 mg  4 mg Oral Q8H PRN Street, [place], [place]      . tamsulosin [facility]) capsule 0.4 mg  0.4 mg Oral Daily [address], [place], [place]   0.4 mg at 01/13/17 1110   Current Outpatient Medications  Medication Sig Dispense Refill  . amLODipine (NORVASC) 5 MG tablet Take 5 mg daily by mouth.    . benztropine (COGENTIN) 0.5 MG tablet Take 0.5 mg by mouth at bedtime.    . benztropine (COGENTIN) 1 MG tablet Take 1 mg by mouth daily.    . busPIRone (BUSPAR) 15 MG tablet Take 15 mg 3 (three) times daily by mouth. 8am, 1 pm, 8 pm    . divalproex (DEPAKOTE) 500 MG DR tablet Take 500 mg 2 (two) times daily by mouth.    . haloperidol (HALDOL) 5 MG tablet Take 15 mg by mouth 2 (two) times daily.    Marland Knoedler levETIRAcetam (KEPPRA) 250 MG tablet Take 250 mg by mouth 2 (two) times daily.    Marland Makarewicz lithium 300 MG tablet Take 600 mg by mouth 2 (two) times daily.    . Skin Protectants, Misc. (MINERIN) CREA Apply 1 application daily topically. To bilateral lower extremities.    . tamsulosin (FLOMAX) 0.4 MG CAPS capsule Take 1 capsule (0.4 mg total) by mouth daily. 30 capsule 0  . albuterol (PROVENTIL HFA;VENTOLIN HFA) 108 (90 BASE) MCG/ACT inhaler Inhale 2 puffs into the lungs every 2 (two) hours as needed for wheezing or shortness of breath (cough). (Patient not taking: Reported on 04/16/2016) 1 Inhaler 0    Musculoskeletal: Strength & Muscle Tone: within normal limits Gait & Station: normal Patient leans: N/A  Psychiatric Specialty Exam: Physical Exam  Constitutional: He appears well-developed and well-nourished.  Respiratory: Effort normal.  Musculoskeletal: Normal range of motion.   Neurological: He is alert.  Psychiatric: He has a normal mood and affect. Thought content normal. His speech is delayed. He is slowed. He expresses impulsivity. He exhibits abnormal recent memory and abnormal remote memory.    Review of Systems  Psychiatric/Behavioral: Positive for memory loss. Negative for depression, hallucinations, substance abuse and suicidal ideas. The patient is not nervous/anxious and does not have insomnia.   All other systems reviewed and are negative.   Blood pressure 115/70, pulse 73, temperature 97.7 F (36.5 C), temperature source Oral, resp. rate 18, weight 129.3 kg (285 lb), SpO2 100 %.Body mass index is 39.75 kg/m.  General Appearance: Casual  Eye  Contact:  Good  Speech:  Slow and stutter  Volume:  Decreased  Mood:  Depressed  Affect:  Congruent and Depressed  Thought Process:  Disorganized  Orientation:  Other:  person  Thought Content:  Illogical  Suicidal Thoughts:  No  Homicidal Thoughts:  No  Memory:  Immediate;   Fair Recent;   Fair Remote;   Poor  Judgement:  Impaired  Insight:  Lacking  Psychomotor Activity:  Decreased  Concentration:  Concentration: Fair and Attention Span: Fair  Recall:  Poor  Fund of Knowledge:  Fair  Language:  Good  Akathisia:  No  Handed:  Right  AIMS (if indicated):   N/A  Assets:  Financial Resources/Insurance Housing Resilience Social Support  ADL's:  Intact  Cognition:  WNL  Sleep:   Okay     Treatment Plan Summary: Plan Dementia due to Parkinson's disease with behavioral disturbance ([place])  Pt is psychiatrically cleared Social work to contact facility to inquire about return back to [facility] Take all medications as prescribed   Disposition: No evidence of imminent risk to self or others at present.   Patient does not meet criteria for psychiatric inpatient admission. Supportive therapy provided about ongoing stressors. Discussed crisis plan, support from social network, calling 911, coming  to the Emergency Department, and calling Suicide Hotline.  Ethelene Hal, NP 01/13/2017 2:03 PM   Patient seen face-to-face for psychiatric evaluation, chart reviewed and case discussed with the physician extender and developed treatment plan. Reviewed the information documented and agree with the treatment plan.  Buford Dresser, DO

## 2017-01-14 NOTE — ED Notes (Signed)
Pt id disoriented to date, place, time.

## 2017-01-14 NOTE — ED Notes (Signed)
Patient slept on and off during the night. Observed sitting in chair.Calm and cooperative without disturbance. Ambulatory and able to toilet self.

## 2017-01-14 NOTE — Progress Notes (Signed)
Pt cleared psychiatrically and determined to not need inpatient placement per psychiatric evaluation 01/13/17. P.m. CSW was informed admissions not available during after hours last night in order to have pt return to [facility].  CSW this morning spoke with Tori at ALF- informed her of above. As pt was placed in restraints briefly around 5:30pm last night (per TCU RN)- needs to be out of restraints x 24 hours prior to returning.  Tori had questions re: if pt has UTI and if medications were changed while in ED. Asks to speak with ED staff re this when available.  Ilean SkillMeghan Elesa Garman, MSW, LCSW Clinical Social Work 01/14/2017 Coverage for [phone]

## 2017-01-15 NOTE — Progress Notes (Addendum)
CSW following to assist with [facility] CSW spoke with Terri [phone]) at facility, informed patient has been medically and psychiatrically cleared.  She reports concerns with patient behavior and need for medication adjustment if possible.  CSW inform patient nurse to determine if possible.  Waiting for return call.   12:47pmNurse report per physician the facility will need to contact the patient's primary care physician to adjust medications.  CSW inform Salvadore Oxfordorri, she report the patient is able to return at this time. They do not have transportation for patient.  Patient has dementia unable to ride bus or taxi. EMS to transport. Nurse will arrange.  No other needs identified. CSW singing off.   Vivi BarrackNicole Sailor Haughn, Theresia MajorsLCSWA, MSW Clinical Social Worker  01/15/2017  9:37 AM

## 2017-01-15 NOTE — ED Notes (Signed)
Brother, Lynder Parentsdward Tupou's phone [phone].

## 2017-01-16 NOTE — ED Notes (Signed)
Pt wheeled out and sent home with [facility].

## 2017-01-16 NOTE — ED Notes (Signed)
[facility] arrived to take patient back.

## 2017-01-16 NOTE — Progress Notes (Addendum)
Type of Service: Clinical Social Work Consult  Burgess EstelleBernard E Calzada is a 51 y.o. male that remain in the ED after last CSW note indicated patient was psychiatrically and medically cleared. Facility was also in agreement for patient to return.    LCSW call RN and facility to assess why patient remain in the ED.   Per Salvadore Oxfordorri at [facility] they will send staff to pick up patient within the hour.  Informed LCSW she was told patient would discharge back to facility yesterday via EMS.  RN informed of the above she will get patient dressed. LCSW provided RN the phone number to [facility] in the event she need to call.  PLAN: Patient will discharge back to [facility] and staff will transport.  No other Social Work services needs at this time.  Sammuel Hineseborah Rubin Dais, LCSW Licensed Clinical Social Worker 2:47 PM

## 2018-06-19 IMAGING — CT CT HEAD W/O CM
4 series · 17 of 47 positions shown, 19 images · non-contrast
Comparison: 04/16/2016

CLINICAL DATA: Altered mental status

EXAM:
CT HEAD WITHOUT CONTRAST
TECHNIQUE: Contiguous axial images were obtained from the base of the skull
through the vertex without intravenous contrast.

[Series 2: head without · axial · non-contrast · 0.44mm/px · z∈[-107,+13]mm · 7 of 32 slices shown, 9 images]
[im 4/32  brain]
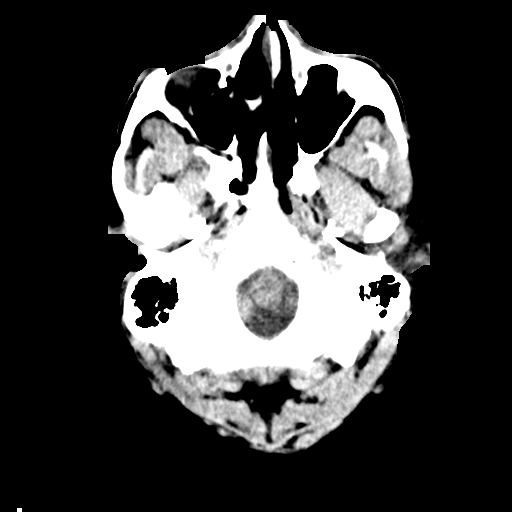
[im 4/32  bone]
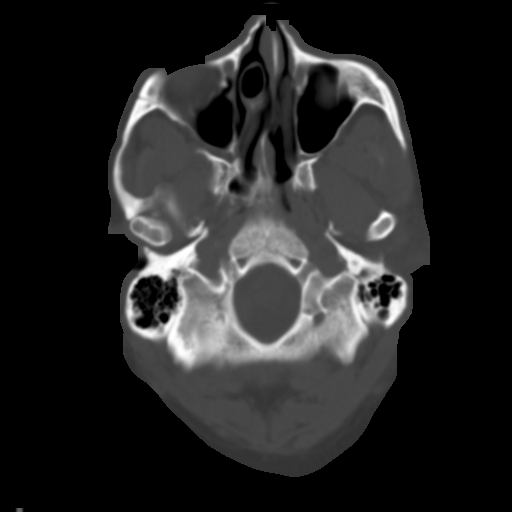
[im 8/32  brain]
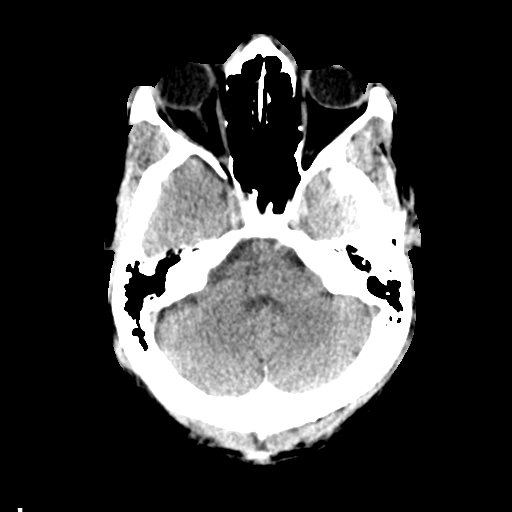
[im 12/32  brain]
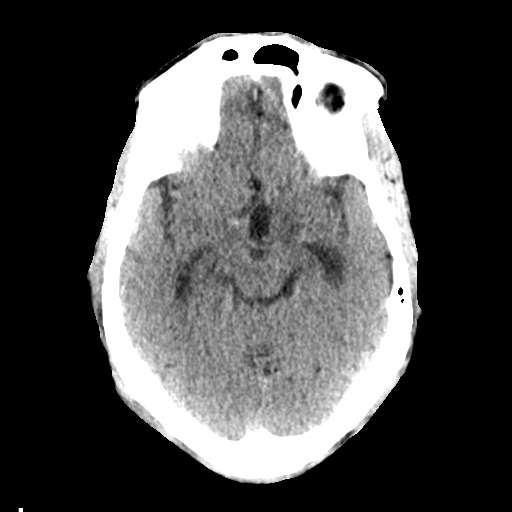
[im 16/32  brain]
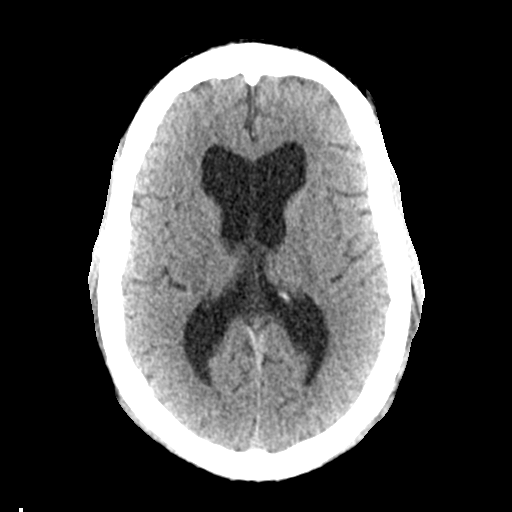
[im 20/32  brain]
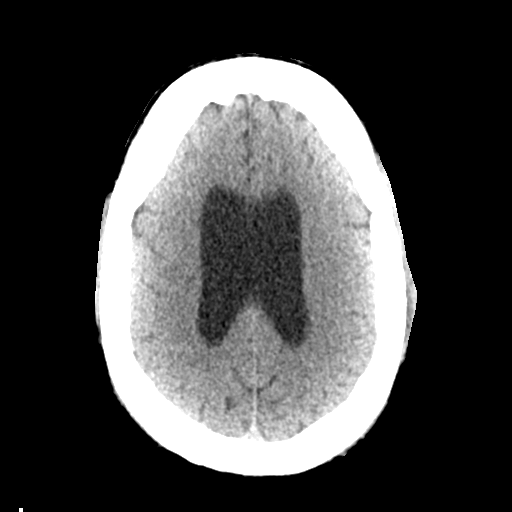
[im 20/32  bone]
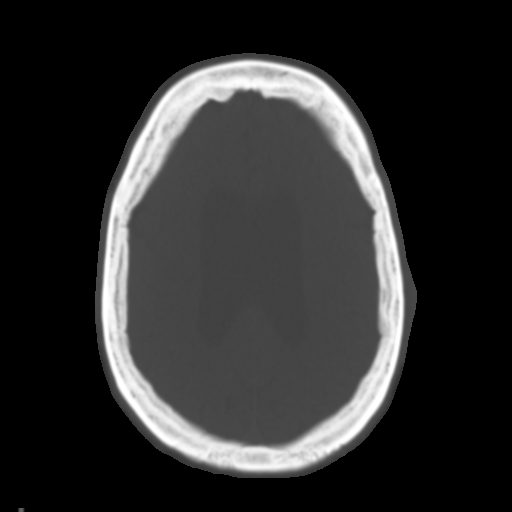
[im 24/32  brain]
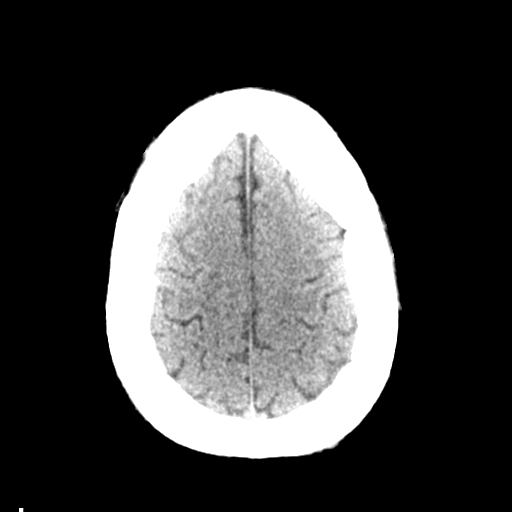
[im 28/32  brain]
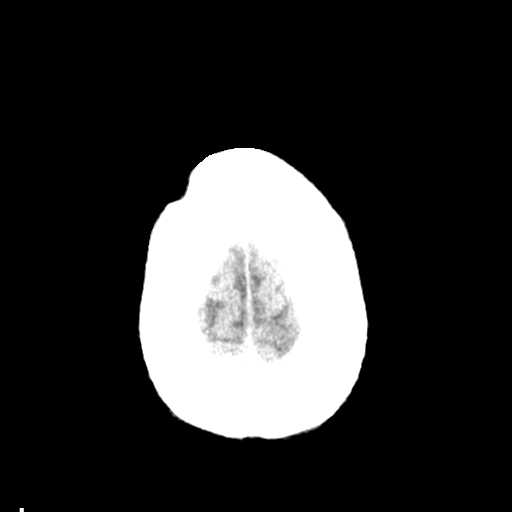

[Series 3: head bone · axial · 0.44mm/px · z∈[-108,-52]mm · 4 of 80 slices shown]
[im 8/80  bone]
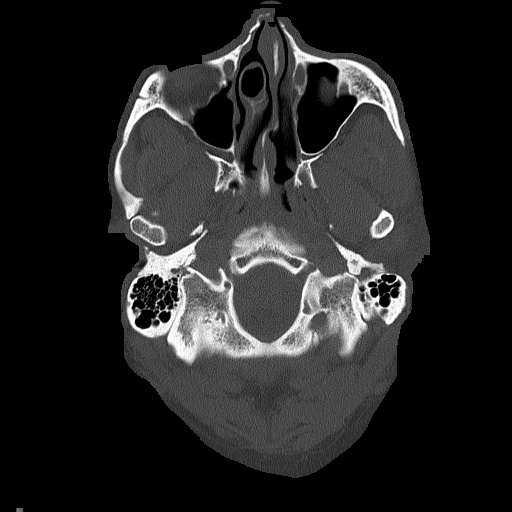
[im 16/80  bone]
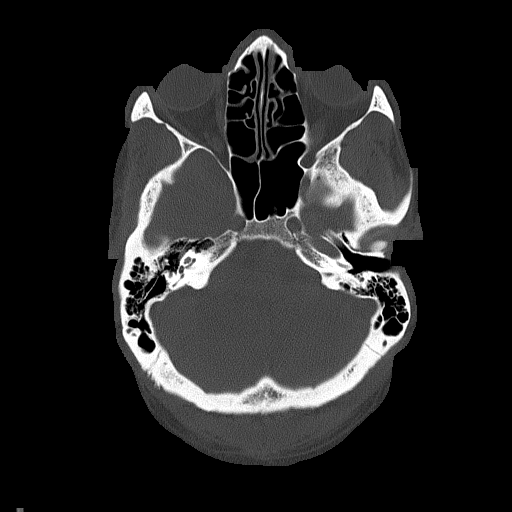
[im 24/80  bone]
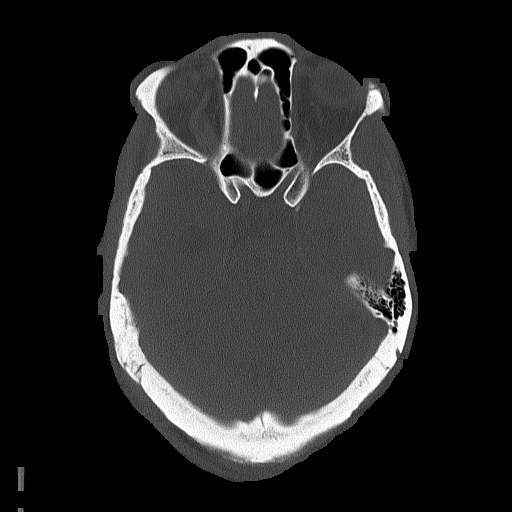
[im 36/80  bone]
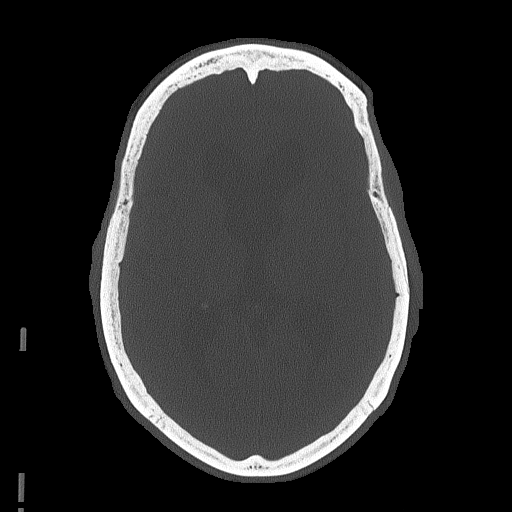

[Series 4: head without cor · coronal · non-contrast · 0.34mm/px · 3 of 69 slices shown]
[im 25/69  brain]
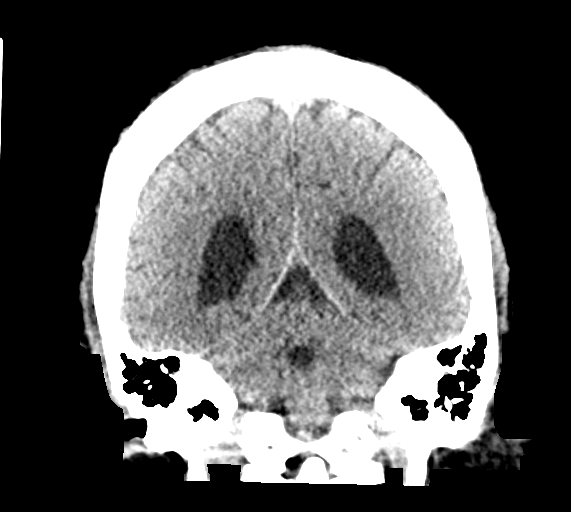
[im 31/69  brain]
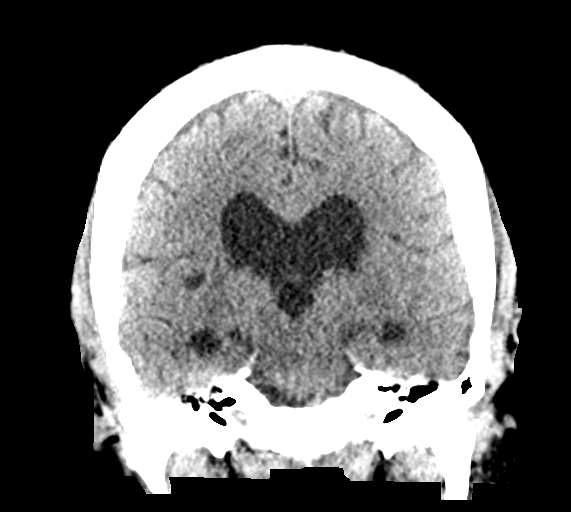
[im 38/69  brain]
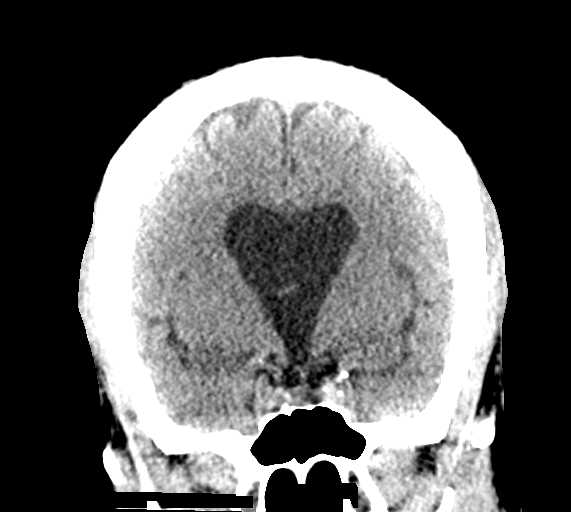

[Series 5: head without sag · sagittal · non-contrast · 0.34mm/px · 3 of 55 slices shown]
[im 19/55  brain]
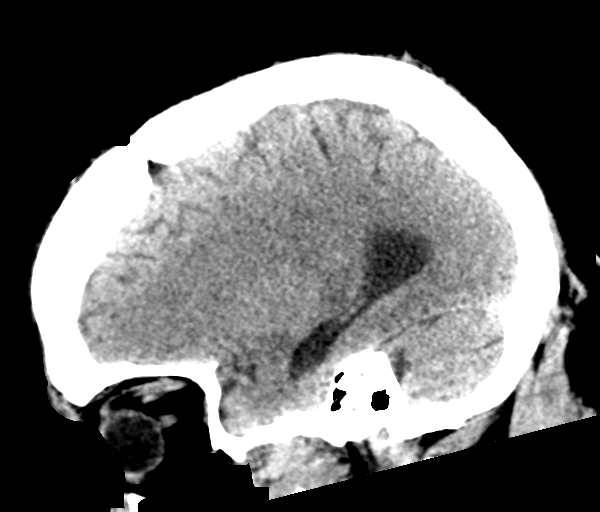
[im 28/55  brain]
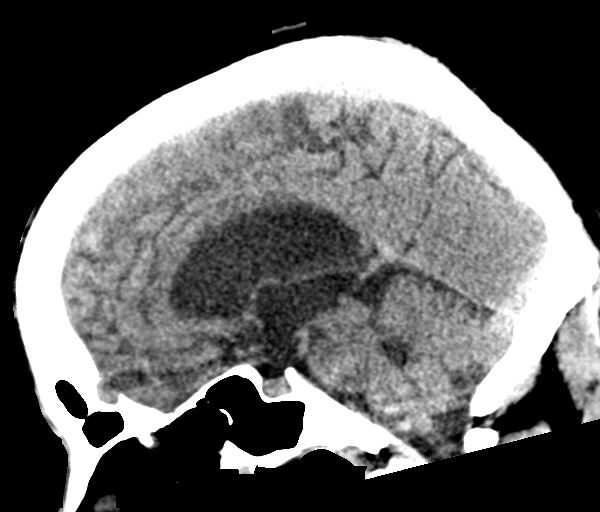
[im 37/55  brain]
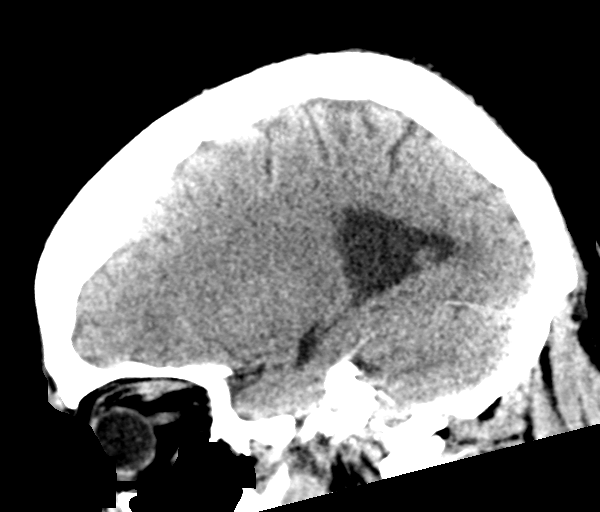

[17 of 47 positions shown; findings below may reference images not displayed]

FINDINGS: Brain: Ventricular dilatation is noted stable from the prior exam.
No findings to suggest acute hemorrhage, acute infarction or
space-occupying mass lesion are noted.

Vascular: No hyperdense vessel or unexpected calcification.

Skull: Burr hole is again noted in the right frontal region stable
from the previous exam.

Sinuses/Orbits: No acute finding.

Other: None.
IMPRESSION: Stable ventricular enlargement.  No acute abnormality is noted.
